# Patient Record
Sex: Female | Born: 1955 | Race: White | Hispanic: No | Marital: Married | Smoking: Former smoker
Health system: Southern US, Community
[De-identification: ages and names within clinical notes are randomized; demographics above are authoritative.]

## PROBLEM LIST (undated history)

## (undated) DIAGNOSIS — H269 Unspecified cataract: Secondary | ICD-10-CM

## (undated) DIAGNOSIS — D352 Benign neoplasm of pituitary gland: Secondary | ICD-10-CM

## (undated) DIAGNOSIS — T4145XA Adverse effect of unspecified anesthetic, initial encounter: Secondary | ICD-10-CM

## (undated) DIAGNOSIS — K219 Gastro-esophageal reflux disease without esophagitis: Secondary | ICD-10-CM

## (undated) DIAGNOSIS — D369 Benign neoplasm, unspecified site: Secondary | ICD-10-CM

## (undated) DIAGNOSIS — I10 Essential (primary) hypertension: Secondary | ICD-10-CM

## (undated) DIAGNOSIS — F419 Anxiety disorder, unspecified: Secondary | ICD-10-CM

## (undated) DIAGNOSIS — IMO0001 Reserved for inherently not codable concepts without codable children: Secondary | ICD-10-CM

## (undated) DIAGNOSIS — D497 Neoplasm of unspecified behavior of endocrine glands and other parts of nervous system: Secondary | ICD-10-CM

## (undated) DIAGNOSIS — K589 Irritable bowel syndrome without diarrhea: Secondary | ICD-10-CM

## (undated) DIAGNOSIS — M1990 Unspecified osteoarthritis, unspecified site: Secondary | ICD-10-CM

## (undated) DIAGNOSIS — K529 Noninfective gastroenteritis and colitis, unspecified: Secondary | ICD-10-CM

## (undated) DIAGNOSIS — M7989 Other specified soft tissue disorders: Secondary | ICD-10-CM

## (undated) DIAGNOSIS — E119 Type 2 diabetes mellitus without complications: Secondary | ICD-10-CM

## (undated) DIAGNOSIS — N200 Calculus of kidney: Secondary | ICD-10-CM

## (undated) DIAGNOSIS — E039 Hypothyroidism, unspecified: Secondary | ICD-10-CM

## (undated) DIAGNOSIS — I499 Cardiac arrhythmia, unspecified: Secondary | ICD-10-CM

## (undated) DIAGNOSIS — E079 Disorder of thyroid, unspecified: Secondary | ICD-10-CM

## (undated) DIAGNOSIS — R011 Cardiac murmur, unspecified: Secondary | ICD-10-CM

## (undated) HISTORY — DX: Calculus of kidney: N20.0

## (undated) HISTORY — PX: WISDOM TOOTH EXTRACTION: SHX21

## (undated) HISTORY — DX: Unspecified cataract: H26.9

## (undated) HISTORY — DX: Irritable bowel syndrome, unspecified: K58.9

## (undated) HISTORY — PX: CHOLECYSTECTOMY: SHX55

## (undated) HISTORY — PX: CERVICAL CERCLAGE: SHX1329

## (undated) HISTORY — DX: Noninfective gastroenteritis and colitis, unspecified: K52.9

## (undated) HISTORY — DX: Benign neoplasm, unspecified site: D36.9

## (undated) HISTORY — PX: TONSILLECTOMY: SUR1361

## (undated) HISTORY — DX: Neoplasm of unspecified behavior of endocrine glands and other parts of nervous system: D49.7

## (undated) HISTORY — DX: Benign neoplasm of pituitary gland: D35.2

---

## 2000-02-09 ENCOUNTER — Encounter: Admission: RE | Admit: 2000-02-09 | Discharge: 2000-02-09 | Payer: Self-pay | Admitting: Obstetrics & Gynecology

## 2000-02-09 ENCOUNTER — Encounter: Payer: Self-pay | Admitting: Obstetrics & Gynecology

## 2000-06-11 DIAGNOSIS — T8859XA Other complications of anesthesia, initial encounter: Secondary | ICD-10-CM

## 2000-06-11 HISTORY — DX: Other complications of anesthesia, initial encounter: T88.59XA

## 2002-01-16 ENCOUNTER — Other Ambulatory Visit: Admission: RE | Admit: 2002-01-16 | Discharge: 2002-01-16 | Payer: Self-pay | Admitting: Obstetrics & Gynecology

## 2002-10-14 ENCOUNTER — Encounter: Payer: Self-pay | Admitting: Internal Medicine

## 2003-02-23 ENCOUNTER — Other Ambulatory Visit: Admission: RE | Admit: 2003-02-23 | Discharge: 2003-02-23 | Payer: Self-pay | Admitting: Obstetrics & Gynecology

## 2003-03-11 ENCOUNTER — Encounter: Admission: RE | Admit: 2003-03-11 | Discharge: 2003-03-11 | Payer: Self-pay | Admitting: Obstetrics & Gynecology

## 2003-03-11 ENCOUNTER — Encounter: Payer: Self-pay | Admitting: Obstetrics & Gynecology

## 2004-04-26 ENCOUNTER — Ambulatory Visit: Payer: Self-pay | Admitting: Internal Medicine

## 2004-09-04 ENCOUNTER — Other Ambulatory Visit: Admission: RE | Admit: 2004-09-04 | Discharge: 2004-09-04 | Payer: Self-pay | Admitting: Obstetrics & Gynecology

## 2004-09-14 ENCOUNTER — Ambulatory Visit: Payer: Self-pay | Admitting: Internal Medicine

## 2004-12-07 ENCOUNTER — Ambulatory Visit: Payer: Self-pay | Admitting: Family Medicine

## 2004-12-13 ENCOUNTER — Ambulatory Visit: Payer: Self-pay | Admitting: Internal Medicine

## 2005-03-22 ENCOUNTER — Ambulatory Visit: Payer: Self-pay | Admitting: Internal Medicine

## 2005-04-06 ENCOUNTER — Ambulatory Visit: Payer: Self-pay | Admitting: Internal Medicine

## 2005-06-05 ENCOUNTER — Ambulatory Visit: Payer: Self-pay | Admitting: Family Medicine

## 2005-06-15 ENCOUNTER — Ambulatory Visit: Payer: Self-pay | Admitting: Internal Medicine

## 2006-01-27 ENCOUNTER — Encounter: Payer: Self-pay | Admitting: Internal Medicine

## 2006-04-16 ENCOUNTER — Ambulatory Visit: Payer: Self-pay | Admitting: Internal Medicine

## 2006-05-16 ENCOUNTER — Ambulatory Visit: Payer: Self-pay | Admitting: Internal Medicine

## 2006-05-17 ENCOUNTER — Ambulatory Visit: Payer: Self-pay | Admitting: Pulmonary Disease

## 2006-06-27 ENCOUNTER — Ambulatory Visit: Payer: Self-pay | Admitting: Pulmonary Disease

## 2006-06-27 ENCOUNTER — Ambulatory Visit (HOSPITAL_BASED_OUTPATIENT_CLINIC_OR_DEPARTMENT_OTHER): Admission: RE | Admit: 2006-06-27 | Discharge: 2006-06-27 | Payer: Self-pay | Admitting: Pulmonary Disease

## 2006-12-18 ENCOUNTER — Ambulatory Visit: Payer: Self-pay | Admitting: Internal Medicine

## 2006-12-18 DIAGNOSIS — S86819A Strain of other muscle(s) and tendon(s) at lower leg level, unspecified leg, initial encounter: Secondary | ICD-10-CM

## 2006-12-18 DIAGNOSIS — S838X9A Sprain of other specified parts of unspecified knee, initial encounter: Secondary | ICD-10-CM | POA: Insufficient documentation

## 2006-12-30 ENCOUNTER — Ambulatory Visit: Payer: Self-pay | Admitting: Internal Medicine

## 2006-12-30 LAB — CONVERTED CEMR LAB: Rapid Strep: NEGATIVE

## 2007-02-27 DIAGNOSIS — D353 Benign neoplasm of craniopharyngeal duct: Secondary | ICD-10-CM

## 2007-02-27 DIAGNOSIS — H409 Unspecified glaucoma: Secondary | ICD-10-CM | POA: Insufficient documentation

## 2007-02-27 DIAGNOSIS — J309 Allergic rhinitis, unspecified: Secondary | ICD-10-CM | POA: Insufficient documentation

## 2007-02-27 DIAGNOSIS — D352 Benign neoplasm of pituitary gland: Secondary | ICD-10-CM

## 2007-03-14 ENCOUNTER — Observation Stay: Payer: Self-pay | Admitting: Internal Medicine

## 2007-03-14 ENCOUNTER — Other Ambulatory Visit: Payer: Self-pay

## 2007-03-15 ENCOUNTER — Encounter: Payer: Self-pay | Admitting: Internal Medicine

## 2007-03-18 ENCOUNTER — Encounter: Payer: Self-pay | Admitting: Internal Medicine

## 2007-03-25 ENCOUNTER — Ambulatory Visit: Payer: Self-pay | Admitting: Internal Medicine

## 2007-03-25 DIAGNOSIS — E785 Hyperlipidemia, unspecified: Secondary | ICD-10-CM | POA: Insufficient documentation

## 2007-03-25 DIAGNOSIS — I10 Essential (primary) hypertension: Secondary | ICD-10-CM | POA: Insufficient documentation

## 2007-03-25 DIAGNOSIS — I872 Venous insufficiency (chronic) (peripheral): Secondary | ICD-10-CM | POA: Insufficient documentation

## 2007-03-25 LAB — CONVERTED CEMR LAB
Albumin: 3.8 g/dL (ref 3.5–5.2)
BUN: 6 mg/dL (ref 6–23)
CO2: 30 meq/L (ref 19–32)
Calcium: 9.6 mg/dL (ref 8.4–10.5)
Chloride: 108 meq/L (ref 96–112)
Creatinine, Ser: 0.6 mg/dL (ref 0.4–1.2)
GFR calc Af Amer: 137 mL/min
GFR calc non Af Amer: 113 mL/min
Glucose, Bld: 112 mg/dL — ABNORMAL HIGH (ref 70–99)
Phosphorus: 3.6 mg/dL (ref 2.3–4.6)
Potassium: 4.8 meq/L (ref 3.5–5.1)
Prolactin: 39.9 ng/mL
Sodium: 144 meq/L (ref 135–145)

## 2007-04-23 ENCOUNTER — Telehealth: Payer: Self-pay | Admitting: Internal Medicine

## 2007-04-24 ENCOUNTER — Telehealth (INDEPENDENT_AMBULATORY_CARE_PROVIDER_SITE_OTHER): Payer: Self-pay | Admitting: *Deleted

## 2007-04-28 ENCOUNTER — Ambulatory Visit: Payer: Self-pay | Admitting: Family Medicine

## 2007-04-28 DIAGNOSIS — J019 Acute sinusitis, unspecified: Secondary | ICD-10-CM

## 2007-07-01 ENCOUNTER — Encounter (INDEPENDENT_AMBULATORY_CARE_PROVIDER_SITE_OTHER): Payer: Self-pay | Admitting: *Deleted

## 2008-02-18 ENCOUNTER — Telehealth: Payer: Self-pay | Admitting: Internal Medicine

## 2008-02-19 ENCOUNTER — Telehealth: Payer: Self-pay | Admitting: Family Medicine

## 2008-06-09 ENCOUNTER — Ambulatory Visit: Payer: Self-pay | Admitting: Internal Medicine

## 2008-06-10 LAB — CONVERTED CEMR LAB
Albumin: 3.7 g/dL (ref 3.5–5.2)
BUN: 8 mg/dL (ref 6–23)
CO2: 30 meq/L (ref 19–32)
Calcium: 9.2 mg/dL (ref 8.4–10.5)
Chloride: 103 meq/L (ref 96–112)
Creatinine, Ser: 0.6 mg/dL (ref 0.4–1.2)
GFR calc Af Amer: 136 mL/min
GFR calc non Af Amer: 112 mL/min
Glucose, Bld: 97 mg/dL (ref 70–99)
Phosphorus: 4.7 mg/dL — ABNORMAL HIGH (ref 2.3–4.6)
Potassium: 4.2 meq/L (ref 3.5–5.1)
Prolactin: 30.6 ng/mL
Sodium: 140 meq/L (ref 135–145)
TSH: 5.29 microintl units/mL (ref 0.35–5.50)

## 2009-03-17 ENCOUNTER — Ambulatory Visit: Payer: Self-pay | Admitting: Internal Medicine

## 2010-08-22 ENCOUNTER — Ambulatory Visit: Payer: Self-pay | Admitting: Internal Medicine

## 2010-10-27 NOTE — Procedures (Signed)
Diamond Sosa, CROKER NO.:  [id number]   MEDICAL RECORD NO.:  [id number]          PATIENT TYPE:  OUT   LOCATION:  SLEEP CENTER                 FACILITY:  [facility]   PHYSICIAN:  Coralyn Helling, MD        DATE OF BIRTH:  [birth year]   DATE OF STUDY:  06/27/2006                            NOCTURNAL POLYSOMNOGRAM   INDICATION FOR STUDY:  This individual has symptoms of sleep disruption  and excessive daytime sleepiness. She is referred to the sleep lab for  evaluation of hypersomnia with obstructive sleep apnea.   EPWORTH SLEEPINESS SCORE:  11.   MEDICATIONS:  None.   SLEEP ARCHITECTURE:  Total recording time was 370 minutes. Total sleep  time was 100 minutes. Sleep efficiency was 27% which is significantly  reduced. Sleep latency was 43.5 minutes. The study was notable for the  reduction in the percentage of slow wave sleep to 4% of the study and of  REM sleep to 0% of the study. The patient slept exclusively in the non  supine position.   RESPIRATORY DATA:  The average respiratory rate was 15. The  apnea/hypopnea index was 0. Occasional snoring was noted by the  technician.   OXYGEN DATA:  The baseline oxygenation was 98%. The oxygen saturation  was 92%. The patient spent the entire test period with an oxygen  saturation between 91 to 100%.   CARDIAC DATA:  Rhythm strip showed normal sinus rhythm.   MOVEMENT-PARASOMNIA:  The periodic limb movement index was 0. The  patient was noted to be quite anxious prior to the study and made a  comment to the technician that it was difficult for her to fall asleep  away from home.   IMPRESSIONS-RECOMMENDATIONS:  This study did not show evidence for sleep  disorder breathing/  However she had a significantly reduced sleep time  and quite fragmented sleep. Initially she did not have supine sleep or  REM sleep. Therefore the severity of any possible sleep disorder  breathing could be significantly  underestimated.  Consideration should be given to having her return to  the sleep lab for a repeat overnight polysomnogram with the possible  addition of a sleep aide to assist with sleep onset and sleep  maintenance.      Coralyn Helling, MD  Diplomat, American Board of Sleep Medicine  Electronically Signed     VS/MEDQ  D:  07/16/2006 16:16:37  T:  07/16/2006 20:40:37  Job:  [id number]

## 2010-10-27 NOTE — Assessment & Plan Note (Signed)
[facility]                             PULMONARY OFFICE NOTE   KAROLYNA, BIANCHINI                       MRN:          [id number]  DATE:05/17/2006                            DOB:          [birth year]    REFERRING PHYSICIAN:  Karie Schwalbe, MD   I met Diamond Sosa today for evaluation of her sleep difficulties.   She says she has noticed this more so over the last two years.  Apparently, she had a family friend who had died recently with similar  symptoms to [name], and this in a sense had been part of the  interest for her getting further evaluation. She will typically go to  bed around 10:00 at night, although she is frequently falling asleep  before this unintentionally. She sleeps through the night for the most  part and wakes up at about 6 in the morning. She says that when she  wakes up she still feels quite tired as well as getting occasional  headaches. Her husband has told her that she snores at night as well as  stops breathing at night. She will tend to breath more through her mouth  at night as well as get a dry mouth at night. She will also occasionally  drool while she is asleep, and she stated that she does not really dream  very much. She will often times end up taking naps on the weekend to  catch up on her sleep, and she will fall asleep while doing things like  watching TV. Her Epworth score today is 17 out of 24. There is no  history of sleep walking or sleep talking. She denies any symptoms of  restless leg syndrome. There is no history of sleep hallucinations,  sleep paralysis or cataplexy. She is not currently using anything to  help herself fall asleep at night or stay awake during the day.   PAST MEDICAL HISTORY:  1. Significant for borderline hypertension.  2. Map-dot-finger dystrophy of the eyes.  3. Chronic headaches.  4. Allergies.  5. A pituitary microadenoma.   PAST SURGICAL HISTORY:  Significant for a  tonsillectomy and  cholecystectomy.   CURRENT MEDICATIONS:  Muro eye ointment and cystine eye drops.   SHE HAS AN ALLERGY TO ERYTHROMYCIN WHICH CAUSES HER GI DISTRESS.   FAMILY HISTORY:  Significant for her mother who had breast cancer, heart  disease and allergies. Her father had allergies, heart disease and  prostate cancer. Her sister who had arthritis and allergies.   SOCIAL HISTORY:  She is married. 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 She has one child. She quit smoking in  2002. She used to smoke 2 packs of cigarettes per day for 23 years.  There is no significant alcohol use.   REVIEW OF SYSTEMS:  She has had approximately 20 pound weight gain over  the last 2 years.   On physical exam, she is 5 feet, 3 inches tall, 249 pounds. Temperature  is 98.2, blood pressure 138/86, heart rate 104, oxygen saturation is 96%  on room air.  HEENT:  Pupils are active. Extraocular muscles are intact. There is no  sinus tenderness. She has a narrow nasal angle. She has a Mallampati IV  airway with an enlarged tongue with scalloped borders of the tongue as  well as an elongated uvula and prominent tonsils. There is no  lymphadenopathy and no thyromegaly.  HEART:  S1 and S2. Tachycardia. Irregular.  CHEST:  Clear to auscultation.  ABDOMEN:  Obese, soft, nontender.  EXTREMITIES:  She has minimal ankle edema.  NEUROLOGICAL:  No focal deficits were appreciated.   IMPRESSION:  Is that of hypersomnia with excessive daytime sleepiness.  The primary concerns that I would have is that she has obstructive sleep  apnea. To further evaluate this, I will arrange for her to undergo a  split-night polysomnogram. In the meantime, I have discussed with her  the importance of diet, exercise and weight reduction as well as the  avoidance of alcohol and sedatives. Driving precautions were discussed  with her as well.   I will follow up with her in approximately three to  four weeks to review  the results of her sleep study.     Coralyn Helling, MD  Electronically Signed    VS/MedQ  DD: 05/17/2006  DT: 05/18/2006  Job #: [id number]   cc:   Karie Schwalbe, MD

## 2011-02-13 ENCOUNTER — Other Ambulatory Visit: Payer: Self-pay | Admitting: Internal Medicine

## 2011-06-27 ENCOUNTER — Other Ambulatory Visit: Payer: Self-pay | Admitting: Internal Medicine

## 2012-02-18 ENCOUNTER — Other Ambulatory Visit: Payer: Self-pay | Admitting: Internal Medicine

## 2012-02-19 NOTE — Telephone Encounter (Signed)
Dr. Darrick Huntsman please advise, patient has not been seen here and she was a previous patient of Dr. Alphonsus Sias hasn't been seen since 2009.

## 2012-02-19 NOTE — Telephone Encounter (Signed)
Patient last seen 2009 by Dr. Alphonsus Sias, is patient seeing Dr.Tullo now? Dr. Darrick Huntsman last filled 06/27/2011, but patient was not seen. Please advise

## 2012-02-20 ENCOUNTER — Other Ambulatory Visit: Payer: Self-pay | Admitting: Internal Medicine

## 2012-02-20 NOTE — Telephone Encounter (Signed)
No refills until she makes an appt

## 2012-02-21 ENCOUNTER — Telehealth: Payer: Self-pay | Admitting: Internal Medicine

## 2012-02-21 MED ORDER — METOPROLOL SUCCINATE ER 25 MG PO TB24: 25.0000 mg | ORAL_TABLET | Freq: Every day | ORAL | Status: DC

## 2012-02-21 NOTE — Telephone Encounter (Signed)
Patient made appt for November, Rx has been refilled and patient notified.

## 2012-02-21 NOTE — Telephone Encounter (Signed)
Please call her today at [phone], she is completely out of her blood pressure meds. I tried to explain to patient since she hasn't been here Dr. Darrick Huntsman wasn't going to keep refilling her medication it had been over a year since she has seen patient. She did take an appointment for November 12.  Please advise her if we aren't going to refill her medication she said she will have to find another provider that will write the prescription for her she has already been 3 days without medication.

## 2012-02-21 NOTE — Telephone Encounter (Signed)
Please work  [phone]  Until 4pm today Please ask reception to page her Pt has new pt appointment 11/12 she was an old [facility]mp patient.  She needs to have her bp meds refilled until 04/22/12 or what would you recommend her to do to get her meds until then Pt is out of meds.   She didn't reliaze that she hadn't see dr Darrick Huntsman in a year Please call pt ASAP

## 2012-02-21 NOTE — Telephone Encounter (Signed)
Patient has made an appt, rx has been sent in.

## 2012-04-22 ENCOUNTER — Ambulatory Visit: Payer: Self-pay | Admitting: Internal Medicine

## 2012-08-20 ENCOUNTER — Ambulatory Visit: Payer: Self-pay | Admitting: Internal Medicine

## 2013-02-24 ENCOUNTER — Other Ambulatory Visit: Payer: Self-pay | Admitting: *Deleted

## 2014-02-01 DIAGNOSIS — E119 Type 2 diabetes mellitus without complications: Secondary | ICD-10-CM | POA: Insufficient documentation

## 2014-11-19 DIAGNOSIS — K589 Irritable bowel syndrome without diarrhea: Secondary | ICD-10-CM | POA: Insufficient documentation

## 2015-01-25 ENCOUNTER — Emergency Department
Admission: EM | Admit: 2015-01-25 | Discharge: 2015-01-25 | Disposition: A | Discharge status: Home / Self Care | Payer: No Typology Code available for payment source | Attending: Emergency Medicine | Admitting: Emergency Medicine

## 2015-01-25 ENCOUNTER — Encounter: Payer: Self-pay | Admitting: Emergency Medicine

## 2015-01-25 DIAGNOSIS — I159 Secondary hypertension, unspecified: Secondary | ICD-10-CM

## 2015-01-25 DIAGNOSIS — Z87891 Personal history of nicotine dependence: Secondary | ICD-10-CM | POA: Insufficient documentation

## 2015-01-25 DIAGNOSIS — N390 Urinary tract infection, site not specified: Secondary | ICD-10-CM | POA: Insufficient documentation

## 2015-01-25 DIAGNOSIS — E119 Type 2 diabetes mellitus without complications: Secondary | ICD-10-CM | POA: Insufficient documentation

## 2015-01-25 DIAGNOSIS — H73893 Other specified disorders of tympanic membrane, bilateral: Secondary | ICD-10-CM | POA: Diagnosis not present

## 2015-01-25 DIAGNOSIS — J019 Acute sinusitis, unspecified: Secondary | ICD-10-CM | POA: Diagnosis not present

## 2015-01-25 DIAGNOSIS — I10 Essential (primary) hypertension: Secondary | ICD-10-CM | POA: Diagnosis present

## 2015-01-25 DIAGNOSIS — Z79899 Other long term (current) drug therapy: Secondary | ICD-10-CM | POA: Diagnosis not present

## 2015-01-25 DIAGNOSIS — R252 Cramp and spasm: Secondary | ICD-10-CM | POA: Insufficient documentation

## 2015-01-25 DIAGNOSIS — Z792 Long term (current) use of antibiotics: Secondary | ICD-10-CM | POA: Insufficient documentation

## 2015-01-25 HISTORY — DX: Type 2 diabetes mellitus without complications: E11.9

## 2015-01-25 HISTORY — DX: Essential (primary) hypertension: I10

## 2015-01-25 LAB — COMPREHENSIVE METABOLIC PANEL
ALK PHOS: 80 U/L (ref 38–126)
ALT: 31 U/L (ref 14–54)
AST: 27 U/L (ref 15–41)
Albumin: 3.9 g/dL (ref 3.5–5.0)
Anion gap: 11 (ref 5–15)
BUN: 17 mg/dL (ref 6–20)
CALCIUM: 9.2 mg/dL (ref 8.9–10.3)
CO2: 26 mmol/L (ref 22–32)
CREATININE: 0.76 mg/dL (ref 0.44–1.00)
Chloride: 104 mmol/L (ref 101–111)
Glucose, Bld: 160 mg/dL — ABNORMAL HIGH (ref 65–99)
Potassium: 3.8 mmol/L (ref 3.5–5.1)
Sodium: 141 mmol/L (ref 135–145)
Total Bilirubin: 0.2 mg/dL — ABNORMAL LOW (ref 0.3–1.2)
Total Protein: 7.4 g/dL (ref 6.5–8.1)

## 2015-01-25 LAB — CBC
HCT: 41.1 % (ref 35.0–47.0)
HEMOGLOBIN: 13.6 g/dL (ref 12.0–16.0)
MCH: 29.3 pg (ref 26.0–34.0)
MCHC: 33.1 g/dL (ref 32.0–36.0)
MCV: 88.7 fL (ref 80.0–100.0)
PLATELETS: 319 10*3/uL (ref 150–440)
RBC: 4.64 MIL/uL (ref 3.80–5.20)
RDW: 13.4 % (ref 11.5–14.5)
WBC: 10.4 10*3/uL (ref 3.6–11.0)

## 2015-01-25 LAB — URINALYSIS COMPLETE WITH MICROSCOPIC (ARMC ONLY)
BILIRUBIN URINE: NEGATIVE
Bacteria, UA: NONE SEEN
GLUCOSE, UA: NEGATIVE mg/dL
Ketones, ur: NEGATIVE mg/dL
Nitrite: NEGATIVE
Protein, ur: NEGATIVE mg/dL
Specific Gravity, Urine: 1.018 (ref 1.005–1.030)
pH: 5 (ref 5.0–8.0)

## 2015-01-25 LAB — TROPONIN I

## 2015-01-25 MED ORDER — ONDANSETRON 4 MG PO TBDP
4.0000 mg | ORAL_TABLET | Freq: Once | ORAL | Status: AC
  Administered 2015-01-25: 4 mg via ORAL
  Filled 2015-01-25: qty 1

## 2015-01-25 MED ORDER — HYDROCOD POLST-CPM POLST ER 10-8 MG/5ML PO SUER: 5.0000 mL | Freq: Two times a day (BID) | ORAL | Status: DC

## 2015-01-25 MED ORDER — ONDANSETRON HCL 4 MG PO TABS: 4.0000 mg | ORAL_TABLET | Freq: Three times a day (TID) | ORAL | Status: DC | PRN

## 2015-01-25 MED ORDER — HYDROCOD POLST-CPM POLST ER 10-8 MG/5ML PO SUER
5.0000 mL | Freq: Once | ORAL | Status: AC
  Administered 2015-01-25: 5 mL via ORAL
  Filled 2015-01-25: qty 5

## 2015-01-25 MED ORDER — LEVOFLOXACIN 750 MG PO TABS
750.0000 mg | ORAL_TABLET | Freq: Once | ORAL | Status: AC
  Administered 2015-01-25: 750 mg via ORAL
  Filled 2015-01-25: qty 1

## 2015-01-25 MED ORDER — LEVOFLOXACIN 750 MG PO TABS: 750.0000 mg | ORAL_TABLET | Freq: Every day | ORAL | Status: DC

## 2015-01-25 NOTE — ED Provider Notes (Signed)
[facility] Emergency Department Provider Note  ____________________________________________  Time seen: Approximately 5:20 AM  I have reviewed the triage vital signs and the nursing notes.   HISTORY  Chief Complaint Hypertension    HPI Diamond Sosa is a 59 y.o. female who presents to the ED from home with a chief complaint of elevated blood pressure, sinus drainage, nausea and cramping to legs. Patient was placed on a Z-Pak 4 days ago by her PCP for sinusitis. She was also started on alprazolam for anxiety at the same time. Took her blood pressure this evening when she felt symptoms of nausea and cramping; blood pressure was elevated at 188/90 (baseline 140s/80s). Does not feel improved on Z-Pak. Denies fever, chills, chest pain, shortness of breath, abdominal pain, vomiting, diarrhea, vision changes, headache, weakness, numbness, tingling.   Past Medical History  Diagnosis Date  . Diabetes mellitus without complication   . Hypertension     Patient Active Problem List   Diagnosis Date Noted  . SINUSITIS- ACUTE-NOS 04/28/2007  . HYPERLIPIDEMIA 03/25/2007  . HYPERTENSION 03/25/2007  . VENOUS INSUFFICIENCY, CHRONIC 03/25/2007  . 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 GLAUCOMA 02/27/2007  . ALLERGIC RHINITIS 02/27/2007  . SPRAIN/STRAIN, KNEE/LEG NEC 12/18/2006    Past Surgical History  Procedure Laterality Date  . Cholecystectomy      Current Outpatient Rx  Name  Route  Sig  Dispense  Refill  . ALPRAZolam (XANAX) 0.25 MG tablet   Oral   Take 0.25 mg by mouth at bedtime as needed for sleep.         [name] azithromycin (ZITHROMAX) 250 MG tablet   Oral   Take 250 mg by mouth daily.         . cholecalciferol (VITAMIN D) 1000 UNITS tablet   Oral   Take 1,000 Units by mouth daily.         [name] loratadine (CLARITIN) 10 MG tablet   Oral   Take 10 mg by mouth daily as needed for allergies.         . metFORMIN (GLUCOPHAGE-XR) 750 MG 24 hr  tablet   Oral   Take 750 mg by mouth 2 (two) times daily.         . metoprolol succinate (TOPROL-XL) 50 MG 24 hr tablet   Oral   Take 50 mg by mouth daily. Take with or immediately following a meal.         . metoprolol succinate (TOPROL-XL) 25 MG 24 hr tablet   Oral   Take 1 tablet (25 mg total) by mouth daily. Patient not taking: Reported on 01/25/2015   30 tablet   6     Allergies Cefuroxime axetil and Erythromycin base  No family history on file.  Social History Social History  Substance Use Topics  . Smoking status: Former Research scientist (life sciences)  . Smokeless tobacco: None  . Alcohol Use: No    Review of Systems Constitutional: No fever/chills Eyes: No visual changes. ENT: Positive for sinus pain. No sore throat. Cardiovascular: Denies chest pain. Respiratory: Denies shortness of breath. Gastrointestinal: No abdominal pain.  Positive for nausea, no vomiting.  No diarrhea.  No constipation. Genitourinary: Negative for dysuria. Musculoskeletal: Positive for legs cramping. Negative for back pain. Skin: Negative for rash. Neurological: Negative for headaches, focal weakness or numbness.  10-point ROS otherwise negative.  ____________________________________________   PHYSICAL EXAM:  VITAL SIGNS: ED Triage Vitals  Enc Vitals Group     BP 01/25/15 0107 156/57 mmHg  Pulse Rate 01/25/15 0107 85     Resp 01/25/15 0107 18     Temp 01/25/15 0107 98.8 F (37.1 C)     Temp Source 01/25/15 0107 Oral     SpO2 01/25/15 0107 95 %     Weight 01/25/15 0107 249 lb (112.946 kg)     Height 01/25/15 0107 5\' 4"  (1.626 m)     Head Cir --      Peak Flow --      Pain Score 01/25/15 0109 7     Pain Loc --      Pain Edu? --      Excl. in [place]? --     Constitutional: Alert and oriented. Well appearing and in no acute distress. Eyes: Conjunctivae are normal. PERRL. EOMI. Head: Atraumatic. Frontal and maxillary sinuses tender to palpation. Bilateral TMs with mild fluid, no  erythema. Nose: No congestion/rhinnorhea. Mouth/Throat: Mucous membranes are moist.  Oropharynx non-erythematous. Postnasal drip noted on exam. Hoarse voice noted. There is no muffled voice. There is no drooling. Neck: No stridor. No carotid bruits. Hematological/Lymphatic/Immunilogical: No cervical lymphadenopathy. Cardiovascular: Normal rate, regular rhythm. Grossly normal heart sounds.  Good peripheral circulation. Respiratory: Normal respiratory effort.  No retractions. Lungs CTAB. Gastrointestinal: Soft and nontender. No distention. No abdominal bruits. No CVA tenderness. Musculoskeletal: No lower extremity tenderness nor edema.  No joint effusions. Neurologic:  Normal speech and language. No gross focal neurologic deficits are appreciated. No gait instability. Skin:  Skin is warm, dry and intact. No rash noted. Psychiatric: Mood and affect are normal. Speech and behavior are normal.  ____________________________________________   LABS (all labs ordered are listed, but only abnormal results are displayed)  Labs Reviewed  COMPREHENSIVE METABOLIC PANEL - Abnormal; Notable for the following:    Glucose, Bld 160 (*)    Total Bilirubin 0.2 (*)    All other components within normal limits  URINALYSIS COMPLETEWITH MICROSCOPIC ([facility] ONLY) - Abnormal; Notable for the following:    Color, Urine YELLOW (*)    APPearance CLEAR (*)    Hgb urine dipstick 1+ (*)    Leukocytes, UA 1+ (*)    Squamous Epithelial / LPF 0-5 (*)    All other components within normal limits  CBC  TROPONIN I   ____________________________________________  EKG  ED ECG REPORT I, Esterlene Atiyeh J, the attending physician, personally viewed and interpreted this ECG.   Date: 01/25/2015  EKG Time: 0113  Rate: 82  Rhythm: normal EKG, normal sinus rhythm  Axis: LAD  Intervals:right bundle branch block  ST&T Change:  Nonspecific  ____________________________________________  RADIOLOGY  None ____________________________________________   PROCEDURES  Procedure(s) performed: None  Critical Care performed: No  ____________________________________________   INITIAL IMPRESSION / ASSESSMENT AND PLAN / ED COURSE  Pertinent labs & imaging results that were available during my care of the patient were reviewed by me and considered in my medical decision making (see chart for details).  59 year old female who presents with elevated blood pressure, likely secondary to sinus infection and UTI. Blood pressure currently 120/70 without intervention. Will change azithromycin to Levaquin. ODT Zofran administered for nausea as well as Tussionex for dry cough. Strict return precautions given. Patient and spouse verbalized understanding and agree with plan of care. ____________________________________________   FINAL CLINICAL IMPRESSION(S) / ED DIAGNOSES  Final diagnoses:  Secondary hypertension, unspecified  UTI (lower urinary tract infection)  Acute sinusitis, recurrence not specified, unspecified location      Paulette Blanch, MD 01/25/15 [phone]

## 2015-01-25 NOTE — ED Notes (Addendum)
Patient ambulatory to triage with steady gait, without difficulty or distress noted; pt reports awakening with nausea and cramping to legs; BP 188/90 at home; st currently taking zpak for sinus infection

## 2015-01-25 NOTE — Discharge Instructions (Signed)
1. Change antibiotics to Levaquin 750 mg daily 6 days. 2. You may take Zofran as needed for nausea. 3. Take cough medicine as needed (Tussionex 5 day supply). 4. Recheck blood pressure with your doctor once you are feeling better. 5. Return to the ER for worsening symptoms, persistent vomiting, fever or other concerns.  Sinusitis Sinusitis is redness, soreness, and inflammation of the paranasal sinuses. Paranasal sinuses are air pockets within the bones of your face (beneath the eyes, the middle of the forehead, or above the eyes). In healthy paranasal sinuses, mucus is able to drain out, and air is able to circulate through them by way of your nose. However, when your paranasal sinuses are inflamed, mucus and air can become trapped. This can allow bacteria and other germs to grow and cause infection. Sinusitis can develop quickly and last only a short time (acute) or continue over a long period (chronic). Sinusitis that lasts for more than 12 weeks is considered chronic.  CAUSES  Causes of sinusitis include:  Allergies.  Structural abnormalities, such as displacement of the cartilage that separates your nostrils (deviated septum), which can decrease the air flow through your nose and sinuses and affect sinus drainage.  Functional abnormalities, such as when the small hairs (cilia) that line your sinuses and help remove mucus do not work properly or are not present. SIGNS AND SYMPTOMS  Symptoms of acute and chronic sinusitis are the same. The primary symptoms are pain and pressure around the affected sinuses. Other symptoms include:  Upper toothache.  Earache.  Headache.  Bad breath.  Decreased sense of smell and taste.  A cough, which worsens when you are lying flat.  Fatigue.  Fever.  Thick drainage from your nose, which often is green and may contain pus (purulent).  Swelling and warmth over the affected sinuses. DIAGNOSIS  Your health care provider will perform a physical  exam. During the exam, your health care provider may:  Look in your nose for signs of abnormal growths in your nostrils (nasal polyps).  Tap over the affected sinus to check for signs of infection.  View the inside of your sinuses (endoscopy) using an imaging device that has a light attached (endoscope). If your health care provider suspects that you have chronic sinusitis, one or more of the following tests may be recommended:  Allergy tests.  Nasal culture. A sample of mucus is taken from your nose, sent to a lab, and screened for bacteria.  Nasal cytology. A sample of mucus is taken from your nose and examined by your health care provider to determine if your sinusitis is related to an allergy. TREATMENT  Most cases of acute sinusitis are related to a viral infection and will resolve on their own within 10 days. Sometimes medicines are prescribed to help relieve symptoms (pain medicine, decongestants, nasal steroid sprays, or saline sprays).  However, for sinusitis related to a bacterial infection, your health care provider will prescribe antibiotic medicines. These are medicines that will help kill the bacteria causing the infection.  Rarely, sinusitis is caused by a fungal infection. In theses cases, your health care provider will prescribe antifungal medicine. For some cases of chronic sinusitis, surgery is needed. Generally, these are cases in which sinusitis recurs more than 3 times per year, despite other treatments. HOME CARE INSTRUCTIONS   Drink plenty of water. Water helps thin the mucus so your sinuses can drain more easily.  Use a humidifier.  Inhale steam 3 to 4 times a day (for  example, sit in the bathroom with the shower running).  Apply a warm, moist washcloth to your face 3 to 4 times a day, or as directed by your health care provider.  Use saline nasal sprays to help moisten and clean your sinuses.  Take medicines only as directed by your health care provider.  If  you were prescribed either an antibiotic or antifungal medicine, finish it all even if you start to feel better. SEEK IMMEDIATE MEDICAL CARE IF:  You have increasing pain or severe headaches.  You have nausea, vomiting, or drowsiness.  You have swelling around your face.  You have vision problems.  You have a stiff neck.  You have difficulty breathing. MAKE SURE YOU:   Understand these instructions.  Will watch your condition.  Will get help right away if you are not doing well or get worse. Document Released: 05/28/2005 Document Revised: 10/12/2013 Document Reviewed: 06/12/2011 [facility] Patient Information 2015 [place], [place]. This information is not intended to replace advice given to you by your health care provider. Make sure you discuss any questions you have with your health care provider.  Urinary Tract Infection Urinary tract infections (UTIs) can develop anywhere along your urinary tract. Your urinary tract is your body's drainage system for removing wastes and extra water. Your urinary tract includes two kidneys, two ureters, a bladder, and a urethra. Your kidneys are a pair of bean-shaped organs. Each kidney is about the size of your fist. They are located below your ribs, one on each side of your spine. CAUSES Infections are caused by microbes, which are microscopic organisms, including fungi, viruses, and bacteria. These organisms are so small that they can only be seen through a microscope. Bacteria are the microbes that most commonly cause UTIs. SYMPTOMS  Symptoms of UTIs may vary by age and gender of the patient and by the location of the infection. Symptoms in young women typically include a frequent and intense urge to urinate and a painful, burning feeling in the bladder or urethra during urination. Older women and men are more likely to be tired, shaky, and weak and have muscle aches and abdominal pain. A fever may mean the infection is in your kidneys. Other  symptoms of a kidney infection include pain in your back or sides below the ribs, nausea, and vomiting. DIAGNOSIS To diagnose a UTI, your caregiver will ask you about your symptoms. Your caregiver also will ask to provide a urine sample. The urine sample will be tested for bacteria and white blood cells. White blood cells are made by your body to help fight infection. TREATMENT  Typically, UTIs can be treated with medication. Because most UTIs are caused by a bacterial infection, they usually can be treated with the use of antibiotics. The choice of antibiotic and length of treatment depend on your symptoms and the type of bacteria causing your infection. HOME CARE INSTRUCTIONS  If you were prescribed antibiotics, take them exactly as your caregiver instructs you. Finish the medication even if you feel better after you have only taken some of the medication.  Drink enough water and fluids to keep your urine clear or pale yellow.  Avoid caffeine, tea, and carbonated beverages. They tend to irritate your bladder.  Empty your bladder often. Avoid holding urine for long periods of time.  Empty your bladder before and after sexual intercourse.  After a bowel movement, women should cleanse from front to back. Use each tissue only once. SEEK MEDICAL CARE IF:   You  have back pain.  You develop a fever.  Your symptoms do not begin to resolve within 3 days. SEEK IMMEDIATE MEDICAL CARE IF:   You have severe back pain or lower abdominal pain.  You develop chills.  You have nausea or vomiting.  You have continued burning or discomfort with urination. MAKE SURE YOU:   Understand these instructions.  Will watch your condition.  Will get help right away if you are not doing well or get worse. Document Released: 03/07/2005 Document Revised: 11/27/2011 Document Reviewed: 07/06/2011 [facility] Patient Information 2015 [place], [place]. This information is not intended to replace advice given to  you by your health care provider. Make sure you discuss any questions you have with your health care provider.  Hypertension Hypertension, commonly called high blood pressure, is when the force of blood pumping through your arteries is too strong. Your arteries are the blood vessels that carry blood from your heart throughout your body. A blood pressure reading consists of a higher number over a lower number, such as 110/72. The higher number (systolic) is the pressure inside your arteries when your heart pumps. The lower number (diastolic) is the pressure inside your arteries when your heart relaxes. Ideally you want your blood pressure below 120/80. Hypertension forces your heart to work harder to pump blood. Your arteries may become narrow or stiff. Having hypertension puts you at risk for heart disease, stroke, and other problems.  RISK FACTORS Some risk factors for high blood pressure are controllable. Others are not.  Risk factors you cannot control include:   Race. You may be at higher risk if you are African American.  Age. Risk increases with age.  Gender. Men are at higher risk than women before age 101 years. After age 30, women are at higher risk than men. Risk factors you can control include:  Not getting enough exercise or physical activity.  Being overweight.  Getting too much fat, sugar, calories, or salt in your diet.  Drinking too much alcohol. SIGNS AND SYMPTOMS Hypertension does not usually cause signs or symptoms. Extremely high blood pressure (hypertensive crisis) may cause headache, anxiety, shortness of breath, and nosebleed. DIAGNOSIS  To check if you have hypertension, your health care provider will measure your blood pressure while you are seated, with your arm held at the level of your heart. It should be measured at least twice using the same arm. Certain conditions can cause a difference in blood pressure between your right and left arms. A blood pressure  reading that is higher than normal on one occasion does not mean that you need treatment. If one blood pressure reading is high, ask your health care provider about having it checked again. TREATMENT  Treating high blood pressure includes making lifestyle changes and possibly taking medicine. Living a healthy lifestyle can help lower high blood pressure. You may need to change some of your habits. Lifestyle changes may include:  Following the DASH diet. This diet is high in fruits, vegetables, and whole grains. It is low in salt, red meat, and added sugars.  Getting at least 2 hours of brisk physical activity every week.  Losing weight if necessary.  Not smoking.  Limiting alcoholic beverages.  Learning ways to reduce stress. If lifestyle changes are not enough to get your blood pressure under control, your health care provider may prescribe medicine. You may need to take more than one. Work closely with your health care provider to understand the risks and benefits. HOME CARE INSTRUCTIONS  Have your blood pressure rechecked as directed by your health care provider.   Take medicines only as directed by your health care provider. Follow the directions carefully. Blood pressure medicines must be taken as prescribed. The medicine does not work as well when you skip doses. Skipping doses also puts you at risk for problems.   Do not smoke.   Monitor your blood pressure at home as directed by your health care provider. SEEK MEDICAL CARE IF:   You think you are having a reaction to medicines taken.  You have recurrent headaches or feel dizzy.  You have swelling in your ankles.  You have trouble with your vision. SEEK IMMEDIATE MEDICAL CARE IF:  You develop a severe headache or confusion.  You have unusual weakness, numbness, or feel faint.  You have severe chest or abdominal pain.  You vomit repeatedly.  You have trouble breathing. MAKE SURE YOU:   Understand these  instructions.  Will watch your condition.  Will get help right away if you are not doing well or get worse. Document Released: 05/28/2005 Document Revised: 10/12/2013 Document Reviewed: 03/20/2013 [facility] Patient Information 2015 [place], [place]. This information is not intended to replace advice given to you by your health care provider. Make sure you discuss any questions you have with your health care provider.

## 2015-01-25 NOTE — ED Notes (Signed)
Patient with no complaints at this time. Respirations even and unlabored. Skin warm/dry. Discharge instructions reviewed with patient at this time. Patient given opportunity to voice concerns/ask questions. Patient discharged at this time and left Emergency Department with steady gait.   

## 2015-06-17 ENCOUNTER — Emergency Department: Payer: BLUE CROSS/BLUE SHIELD

## 2015-06-17 ENCOUNTER — Emergency Department
Admission: EM | Admit: 2015-06-17 | Discharge: 2015-06-17 | Disposition: A | Discharge status: Home / Self Care | Payer: BLUE CROSS/BLUE SHIELD | Attending: Emergency Medicine | Admitting: Emergency Medicine

## 2015-06-17 ENCOUNTER — Encounter: Payer: Self-pay | Admitting: Emergency Medicine

## 2015-06-17 DIAGNOSIS — Z792 Long term (current) use of antibiotics: Secondary | ICD-10-CM | POA: Insufficient documentation

## 2015-06-17 DIAGNOSIS — E119 Type 2 diabetes mellitus without complications: Secondary | ICD-10-CM | POA: Insufficient documentation

## 2015-06-17 DIAGNOSIS — Z87891 Personal history of nicotine dependence: Secondary | ICD-10-CM | POA: Diagnosis not present

## 2015-06-17 DIAGNOSIS — R109 Unspecified abdominal pain: Secondary | ICD-10-CM

## 2015-06-17 DIAGNOSIS — Z7984 Long term (current) use of oral hypoglycemic drugs: Secondary | ICD-10-CM | POA: Insufficient documentation

## 2015-06-17 DIAGNOSIS — I10 Essential (primary) hypertension: Secondary | ICD-10-CM | POA: Insufficient documentation

## 2015-06-17 DIAGNOSIS — N201 Calculus of ureter: Secondary | ICD-10-CM | POA: Insufficient documentation

## 2015-06-17 DIAGNOSIS — Z79899 Other long term (current) drug therapy: Secondary | ICD-10-CM | POA: Diagnosis not present

## 2015-06-17 DIAGNOSIS — R1031 Right lower quadrant pain: Secondary | ICD-10-CM | POA: Diagnosis present

## 2015-06-17 HISTORY — DX: Neoplasm of unspecified behavior of endocrine glands and other parts of nervous system: D49.7

## 2015-06-17 HISTORY — DX: Disorder of thyroid, unspecified: E07.9

## 2015-06-17 LAB — URINALYSIS COMPLETE WITH MICROSCOPIC (ARMC ONLY)
Bilirubin Urine: NEGATIVE
Glucose, UA: >500 mg/dL — AB
Leukocytes, UA: NEGATIVE
Nitrite: NEGATIVE
PROTEIN: NEGATIVE mg/dL
Specific Gravity, Urine: 1.013 (ref 1.005–1.030)
pH: 6 (ref 5.0–8.0)

## 2015-06-17 LAB — CBC
HCT: 41.4 % (ref 35.0–47.0)
HEMOGLOBIN: 13.6 g/dL (ref 12.0–16.0)
MCH: 29.4 pg (ref 26.0–34.0)
MCHC: 32.8 g/dL (ref 32.0–36.0)
MCV: 89.5 fL (ref 80.0–100.0)
Platelets: 335 10*3/uL (ref 150–440)
RBC: 4.62 MIL/uL (ref 3.80–5.20)
RDW: 13.7 % (ref 11.5–14.5)
WBC: 14.7 10*3/uL — ABNORMAL HIGH (ref 3.6–11.0)

## 2015-06-17 LAB — BASIC METABOLIC PANEL
Anion gap: 10 (ref 5–15)
BUN: 20 mg/dL (ref 6–20)
CHLORIDE: 102 mmol/L (ref 101–111)
CO2: 27 mmol/L (ref 22–32)
CREATININE: 1.03 mg/dL — AB (ref 0.44–1.00)
Calcium: 9.5 mg/dL (ref 8.9–10.3)
GFR calc Af Amer: >60 mL/min (ref >60)
GFR calc non Af Amer: 59 mL/min — ABNORMAL LOW (ref >60)
GLUCOSE: 277 mg/dL — AB (ref 65–99)
Potassium: 4.1 mmol/L (ref 3.5–5.1)
SODIUM: 139 mmol/L (ref 135–145)

## 2015-06-17 MED ORDER — ONDANSETRON HCL 4 MG PO TABS: 4.0000 mg | ORAL_TABLET | Freq: Three times a day (TID) | ORAL | Status: DC | PRN

## 2015-06-17 MED ORDER — ONDANSETRON HCL 4 MG/2ML IJ SOLN
INTRAMUSCULAR | Status: AC
  Administered 2015-06-17: 4 mg via INTRAVENOUS
  Filled 2015-06-17: qty 2

## 2015-06-17 MED ORDER — MORPHINE SULFATE (PF) 4 MG/ML IV SOLN
INTRAVENOUS | Status: AC
  Administered 2015-06-17: 4 mg via INTRAVENOUS
  Filled 2015-06-17: qty 1

## 2015-06-17 MED ORDER — ONDANSETRON HCL 4 MG/2ML IJ SOLN
4.0000 mg | Freq: Once | INTRAMUSCULAR | Status: AC
  Administered 2015-06-17: 4 mg via INTRAVENOUS

## 2015-06-17 MED ORDER — TAMSULOSIN HCL 0.4 MG PO CAPS
0.4000 mg | ORAL_CAPSULE | Freq: Every day | ORAL | Status: DC
  Administered 2015-06-17: 0.4 mg via ORAL
  Filled 2015-06-17: qty 1

## 2015-06-17 MED ORDER — MORPHINE SULFATE (PF) 4 MG/ML IV SOLN
4.0000 mg | Freq: Once | INTRAVENOUS | Status: AC
  Administered 2015-06-17: 4 mg via INTRAVENOUS

## 2015-06-17 MED ORDER — HYDROMORPHONE HCL 1 MG/ML IJ SOLN: 1.0000 mg | Freq: Once | INTRAMUSCULAR | Status: DC

## 2015-06-17 MED ORDER — OXYCODONE-ACETAMINOPHEN 5-325 MG PO TABS
2.0000 | ORAL_TABLET | Freq: Once | ORAL | Status: AC
  Administered 2015-06-17: 2 via ORAL

## 2015-06-17 MED ORDER — HYDROMORPHONE HCL 1 MG/ML IJ SOLN
1.0000 mg | Freq: Once | INTRAMUSCULAR | Status: AC
  Administered 2015-06-17: 1 mg via INTRAVENOUS

## 2015-06-17 MED ORDER — HYDROMORPHONE HCL 1 MG/ML IJ SOLN
INTRAMUSCULAR | Status: AC
  Administered 2015-06-17: 1 mg via INTRAVENOUS
  Filled 2015-06-17: qty 1

## 2015-06-17 MED ORDER — KETOROLAC TROMETHAMINE 10 MG PO TABS: 10.0000 mg | ORAL_TABLET | Freq: Three times a day (TID) | ORAL | Status: DC | PRN

## 2015-06-17 MED ORDER — SODIUM CHLORIDE 0.9 % IV BOLUS (SEPSIS)
1000.0000 mL | Freq: Once | INTRAVENOUS | Status: AC
  Administered 2015-06-17: 1000 mL via INTRAVENOUS

## 2015-06-17 MED ORDER — OXYCODONE-ACETAMINOPHEN 5-325 MG PO TABS: 1.0000 | ORAL_TABLET | ORAL | Status: DC | PRN

## 2015-06-17 MED ORDER — HYDROMORPHONE HCL 1 MG/ML IJ SOLN
INTRAMUSCULAR | Status: AC
  Filled 2015-06-17: qty 1

## 2015-06-17 MED ORDER — TAMSULOSIN HCL 0.4 MG PO CAPS: 0.4000 mg | ORAL_CAPSULE | Freq: Every day | ORAL | Status: DC

## 2015-06-17 MED ORDER — KETOROLAC TROMETHAMINE 30 MG/ML IJ SOLN
30.0000 mg | Freq: Once | INTRAMUSCULAR | Status: AC
  Administered 2015-06-17: 30 mg via INTRAVENOUS
  Filled 2015-06-17: qty 1

## 2015-06-17 NOTE — ED Notes (Signed)
Right flank and lower abdominal pain.  Onset of symptoms today at 1500.  Describes a similar experience 2-3 nights ago that stopped.

## 2015-06-17 NOTE — ED Provider Notes (Signed)
[facility] Emergency Department Provider Note   ____________________________________________  Time seen: Approximately 6 PM I have reviewed the triage vital signs and the triage nursing note.  HISTORY  Chief Complaint Flank Pain   Historian Patient  HPI Diamond Sosa is a 60 y.o. female who's had right flank pain extending into the right lower abdomen since this afternoon. Constant but waxing and waning and severe. She had some symptoms a few nights ago, but that went away. She's had a lot of urinary tract infections over the past year, but this is for an worse. No fever. No diarrhea. Positive for nausea and vomiting, which she thinks is probably due to severe pain.  No exacerbating only being factors. Positive for dysuria. No hematuria.  History of cholecystectomy, and typically has 5-6 stools per day which are loose, she has not had vomiting today.   Past Medical History  Diagnosis Date  . Diabetes mellitus without complication ([place])   . Hypertension   . Thyroid disease     hypothyroid  . Pituitary tumor [facility])     Patient Active Problem List   Diagnosis Date Noted  . SINUSITIS- ACUTE-NOS 04/28/2007  . HYPERLIPIDEMIA 03/25/2007  . HYPERTENSION 03/25/2007  . VENOUS INSUFFICIENCY, CHRONIC 03/25/2007  . 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 GLAUCOMA 02/27/2007  . ALLERGIC RHINITIS 02/27/2007  . SPRAIN/STRAIN, KNEE/LEG NEC 12/18/2006    Past Surgical History  Procedure Laterality Date  . Cholecystectomy      Current Outpatient Rx  Name  Route  Sig  Dispense  Refill  . ALPRAZolam (XANAX) 0.25 MG tablet   Oral   Take 0.25 mg by mouth at bedtime as needed for sleep.         . chlorpheniramine-HYDROcodone (TUSSIONEX PENNKINETIC ER) 10-8 MG/5ML SUER   Oral   Take 5 mLs by mouth 2 (two) times daily.   70 mL   0   . cholecalciferol (VITAMIN D) 1000 UNITS tablet   Oral   Take 1,000 Units by mouth daily.         [name] ketorolac (TORADOL)  10 MG tablet   Oral   Take 1 tablet (10 mg total) by mouth every 8 (eight) hours as needed for moderate pain.   15 tablet   0   . levofloxacin (LEVAQUIN) 750 MG tablet   Oral   Take 1 tablet (750 mg total) by mouth daily.   6 tablet   0   . loratadine (CLARITIN) 10 MG tablet   Oral   Take 10 mg by mouth daily as needed for allergies.         . metFORMIN (GLUCOPHAGE-XR) 750 MG 24 hr tablet   Oral   Take 750 mg by mouth 2 (two) times daily.         . metoprolol succinate (TOPROL-XL) 25 MG 24 hr tablet   Oral   Take 1 tablet (25 mg total) by mouth daily. Patient not taking: Reported on 01/25/2015   30 tablet   6   . metoprolol succinate (TOPROL-XL) 50 MG 24 hr tablet   Oral   Take 50 mg by mouth daily. Take with or immediately following a meal.         . ondansetron (ZOFRAN) 4 MG tablet   Oral   Take 1 tablet (4 mg total) by mouth every 8 (eight) hours as needed for nausea or vomiting.   15 tablet   0   . ondansetron (ZOFRAN) 4 MG tablet  Oral   Take 1 tablet (4 mg total) by mouth every 8 (eight) hours as needed for nausea or vomiting.   10 tablet   0   . oxyCODONE-acetaminophen (ROXICET) 5-325 MG tablet   Oral   Take 1-2 tablets by mouth every 4 (four) hours as needed for severe pain.   20 tablet   0   . tamsulosin (FLOMAX) 0.4 MG CAPS capsule   Oral   Take 1 capsule (0.4 mg total) by mouth daily.   7 capsule   0     Allergies Cefuroxime axetil and Erythromycin base  No family history on file.  Social History Social History  Substance Use Topics  . Smoking status: Former Research scientist (life sciences)  . Smokeless tobacco: None  . Alcohol Use: No    Review of Systems  Constitutional: Negative for fever. Eyes: Negative for visual changes. ENT: Negative for sore throat. Cardiovascular: Negative for chest pain. Respiratory: Negative for shortness of breath. Gastrointestinal: Per history of present illness. Genitourinary: Negative for dysuria. Musculoskeletal:  Negative for back pain. Skin: Negative for rash. Neurological: Negative for headache. 10 point Review of Systems otherwise negative ____________________________________________   PHYSICAL EXAM:  VITAL SIGNS: ED Triage Vitals  Enc Vitals Group     BP --      Pulse Rate 06/17/15 1634 70     Resp 06/17/15 1634 20     Temp 06/17/15 1634 98.1 F (36.7 C)     Temp src --      SpO2 06/17/15 1634 96 %     Weight 06/17/15 1634 250 lb (113.399 kg)     Height 06/17/15 1634 5' 3.5" (1.613 m)     Head Cir --      Peak Flow --      Pain Score 06/17/15 1652 10     Pain Loc --      Pain Edu? --      Excl. in [place]? --      Constitutional: Alert and oriented. Moderate distress due to pain and holding her right side. Eyes: Conjunctivae are normal. PERRL. Normal extraocular movements. ENT   Head: Normocephalic and atraumatic.   Nose: No congestion/rhinnorhea.   Mouth/Throat: Mucous membranes are moist.   Neck: No stridor. Cardiovascular/Chest: Normal rate, regular rhythm.  No murmurs, rubs, or gallops. Respiratory: Normal respiratory effort without tachypnea nor retractions. Breath sounds are clear and equal bilaterally. No wheezes/rales/rhonchi. Gastrointestinal: Soft. No distention, no guarding, no rebound. Obese. Right-sided abdominal tenderness to palpation  Genitourinary/rectal:Deferred Musculoskeletal: Nontender with normal range of motion in all extremities. No joint effusions.  No lower extremity tenderness.  No edema. Neurologic:  Normal speech and language. No gross or focal neurologic deficits are appreciated. Skin:  Skin is warm, dry and intact. No rash noted. Psychiatric: Mood and affect are normal. Speech and behavior are normal. Patient exhibits appropriate insight and judgment.  ____________________________________________   EKG I, Lisa Roca, MD, the attending physician have personally viewed and interpreted all  ECGs.  None ____________________________________________  LABS (pertinent positives/negatives)  Basic metabolic panel significant for glucose 277, creatinine 1.03 White blood cell count 14.7, hemoglobin 13.6 and platelet count 335 Urinalysis:   Too numerous to count red blood cells, rare bacteria, negative for leukocytes, negative for nitrites and 0-5 white blood cells  ____________________________________________  RADIOLOGY All Xrays were viewed by me. Imaging interpreted by Radiologist.  CT abdomen and pelvis:  IMPRESSION: There is an obstructing 3 mm stone within the distal right ureter at the UVJ resulting  in moderate right hydroureteronephrosis.  Additional nonobstructing stones within the left kidney as described above.  Fat stranding of the upper abdominal mesentery which is nonspecific and may be seen with mesenteric panniculitis. While not favored, lymphoma is an additional consideration. Consider clinical, laboratory and follow-up imaging evaluation.  Hepatic steatosis. __________________________________________  PROCEDURES  Procedure(s) performed: None  Critical Care performed: None  ____________________________________________   ED COURSE / ASSESSMENT AND PLAN  CONSULTATIONS: None  Pertinent labs & imaging results that were available during my care of the patient were reviewed by me and considered in my medical decision making (see chart for details).  Normal saline bolus, morphine and Zofran were provided for symptomatic relief of right flank pain, clinically felt to likely be kidney stone.  CT scan confirms right UVJ 3 mm kidney stone with mild to moderate Hydro. I discussed this case with the on-call urologist given the increase in creatinine from 0.06 at baseline to 1.03 today, in addition rare bacteria although 0-5 white blood cells and negative for nitrates, and elevated white blood cell count 14.7. Dr. Alyson Ingles recommended no empiric tx for uti,  tx symptomatically for kidney stone and would be ok for outpatient treatment if pain controlled.  ----------------------------------------- 10:05 PM on 06/17/2015 -----------------------------------------  Patient did receive adequate pain control here in the emergency department and is prepared for outpatient management. We did send her home with a urine strainer.  I discussed with her the nonspecific finding of mesenteric inflammation, likely panniculitis, and that other symptoms did not seem consistent with low on the differential lymphoma, but she should follow up next week with primary care physician.  Patient / Family / Caregiver informed of clinical course, medical decision-making process, and agree with plan.   I discussed return precautions, follow-up instructions, and discharged instructions with patient and/or family.  ___________________________________________   FINAL CLINICAL IMPRESSION(S) / ED DIAGNOSES   Final diagnoses:  Right flank pain  Ureterolithiasis              Note: This dictation was prepared with Dragon dictation. Any transcriptional errors that result from this process are unintentional   Lisa Roca, MD 06/17/15 2237

## 2015-06-17 NOTE — Discharge Instructions (Signed)
You were evaluated for right flank pain and found have a kidney stone. Return to the periphery worsening condition including unable to peak, fever, worsening pain.  If you are unable to pass a kidney stone within 1 week, you will need to see a urologist, and may be referred by her primary care physician. If you collector kidney stone while you pass it, you may try and into the lateral your primary care physician's office for analysis.  Your CT scan showed inflammation of the fatty tissue of the abdomen which is likely an inflammatory condition called panniculitis.  You have an incidental finding of a growth on the adrenal gland that appears benign.   Kidney Stones Kidney stones (urolithiasis) are solid masses that form inside your kidneys. The intense pain is caused by the stone moving through the kidney, ureter, bladder, and urethra (urinary tract). When the stone moves, the ureter starts to spasm around the stone. The stone is usually passed in your pee (urine).  HOME CARE  Drink enough fluids to keep your pee clear or pale yellow. This helps to get the stone out.  Take a 24-hour pee (urine) sample as told by your doctor. You may need to take another sample every 6-12 months.  Strain all pee through the provided strainer. Do not pee without peeing through the strainer, not even once. If you pee the stone out, catch it in the strainer. The stone may be as small as a grain of salt. Take this to your doctor. This will help your doctor figure out what you can do to try to prevent more kidney stones.  Only take medicine as told by your doctor.  Make changes to your daily diet as told by your doctor. You may be told to:  Limit how much salt you eat.  Eat 5 or more servings of fruits and vegetables each day.  Limit how much meat, poultry, fish, and eggs you eat.  Keep all follow-up visits as told by your doctor. This is important.  Get follow-up X-rays as told by your doctor. GET HELP  IF: You have pain that gets worse even if you have been taking pain medicine. GET HELP RIGHT AWAY IF:   Your pain does not get better with medicine.  You have a fever or shaking chills.  Your pain increases and gets worse over 18 hours.  You have new belly (abdominal) pain.  You feel faint or pass out.  You are unable to pee.   This information is not intended to replace advice given to you by your health care provider. Make sure you discuss any questions you have with your health care provider.   Document Released: 11/14/2007 Document Revised: 02/16/2015 Document Reviewed: 10/29/2012 Elsevier Interactive Patient Education [employer].

## 2015-06-20 LAB — URINE CULTURE

## 2015-06-24 ENCOUNTER — Encounter: Payer: Self-pay | Admitting: Adult Health

## 2015-06-27 ENCOUNTER — Encounter: Payer: Self-pay | Admitting: *Deleted

## 2015-06-29 ENCOUNTER — Encounter: Payer: Self-pay | Admitting: Obstetrics and Gynecology

## 2015-06-29 ENCOUNTER — Ambulatory Visit: Payer: Self-pay | Admitting: Obstetrics and Gynecology

## 2015-06-29 ENCOUNTER — Telehealth: Payer: Self-pay | Admitting: Urology

## 2015-06-29 NOTE — Telephone Encounter (Signed)
Pt would like for you to give him a call and let him know if the way his urine looks is normal.  Please call him at [phone]

## 2015-07-01 NOTE — Telephone Encounter (Signed)
LMOM

## 2015-07-04 ENCOUNTER — Ambulatory Visit (INDEPENDENT_AMBULATORY_CARE_PROVIDER_SITE_OTHER): Payer: BLUE CROSS/BLUE SHIELD | Admitting: Obstetrics and Gynecology

## 2015-07-04 ENCOUNTER — Encounter: Payer: Self-pay | Admitting: Obstetrics and Gynecology

## 2015-07-04 VITALS — BP 149/70 | HR 80 | Ht 64.0 in | Wt 246.9 lb

## 2015-07-04 DIAGNOSIS — N200 Calculus of kidney: Secondary | ICD-10-CM

## 2015-07-04 LAB — URINALYSIS, COMPLETE
Bilirubin, UA: NEGATIVE
Glucose, UA: NEGATIVE
Ketones, UA: NEGATIVE
Nitrite, UA: NEGATIVE
PH UA: 5 (ref 5.0–7.5)
Specific Gravity, UA: 1.02 (ref 1.005–1.030)
Urobilinogen, Ur: 0.2 mg/dL (ref 0.2–1.0)

## 2015-07-04 LAB — MICROSCOPIC EXAMINATION

## 2015-07-04 NOTE — Progress Notes (Signed)
07/04/2015 4:35 PM   Marchele Armond Hang [birth year] [id number]  Referring provider: Juluis Pitch, MD [address] [place], [place] [address]  Chief Complaint  Patient presents with  . Nephrolithiasis    new pt    HPI:  Patient is a 59 year old female presenting today for follow-up after being seen in the emergency room on 06/17/15 for acute onset of right flank pain. TNTC RBCs on UA.  CT renal stone study demonstrated an obstructing 3 mm stone at the right UVJ with resulting in moderate right hydroureteronephrosis. 2 large left renal stones were also measuring 1.9 cm and 1.2 cm without evidence of obstruction.  No recent gross hematuria but states that she did experience an episode of gross hematuria one year ago. She was told at that time that she had a UTI reports that she was not experiencing any irritating voiding symptoms at that time. Her pain is now completely resolved. She reports no urinary symptoms. She is not aware of stone passage but she reports that she has not been consistent with straining her urine.  No nausea, vomiting or fevers.   No previous history of renal stones.  Reports that she experienced difficulty waking up from general anesthesia in 2002 and is very anxious about being place under again.  Former smoker 2ppd x 18 years.  PMH: Past Medical History  Diagnosis Date  . Diabetes mellitus without complication ([place])   . Hypertension   . Thyroid disease     hypothyroid  . 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 IBS (irritable bowel syndrome)   . Microadenoma ([place])   . Cataracts, bilateral   . Adrenal tumor   . Kidney stone   . Inflammatory bowel disease     Surgical History: Past Surgical History  Procedure Laterality Date  . Cholecystectomy    . Tonsillectomy    . Cervical cerclage      Home Medications:    Medication List       This list is accurate as of: 07/04/15  4:35 PM.  Always use your most recent med  list.               ALPRAZolam 0.25 MG tablet  Commonly known as:  XANAX  Take 0.25 mg by mouth at bedtime as needed for sleep. Reported on 07/04/2015     cholecalciferol 1000 units tablet  Commonly known as:  VITAMIN D  Take 1,000 Units by mouth daily.     loratadine 10 MG tablet  Commonly known as:  CLARITIN  Take 10 mg by mouth daily as needed for allergies.     metFORMIN 750 MG 24 hr tablet  Commonly known as:  GLUCOPHAGE-XR  Take 750 mg by mouth 2 (two) times daily.     metoprolol succinate 50 MG 24 hr tablet  Commonly known as:  TOPROL-XL  Take 50 mg by mouth daily. Take with or immediately following a meal.     ondansetron 4 MG tablet  Commonly known as:  ZOFRAN  Take 1 tablet (4 mg total) by mouth every 8 (eight) hours as needed for nausea or vomiting.     oxyCODONE-acetaminophen 5-325 MG tablet  Commonly known as:  ROXICET  Take 1-2 tablets by mouth every 4 (four) hours as needed for severe pain.     tamsulosin 0.4 MG Caps capsule  Commonly known as:  FLOMAX  Take 1 capsule (0.4 mg total) by mouth daily.  Allergies:  Allergies  Allergen Reactions  . Cefuroxime Axetil     REACTION: GI distress and tongue breaks out  . Erythromycin Base     Family History: Family History  Problem Relation Age of Onset  . Prostate cancer Father   . Urolithiasis Mother   . Kidney disease Neg Hx   . Kidney cancer Neg Hx     Social History:  reports that she has quit smoking. She does not have any smokeless tobacco history on file. She reports that she does not drink alcohol. Her drug history is not on file.  ROS: UROLOGY Frequent Urination?: Yes Hard to postpone urination?: Yes Burning/pain with urination?: No Get up at night to urinate?: Yes Leakage of urine?: Yes Urine stream starts and stops?: No Trouble starting stream?: No Do you have to strain to urinate?: No Blood in urine?: No Urinary tract infection?: No Sexually transmitted disease?:  No Injury to kidneys or bladder?: No Painful intercourse?: No Weak stream?: No Currently pregnant?: No Vaginal bleeding?: No Last menstrual period?: 2003  Gastrointestinal Nausea?: Yes Vomiting?: Yes Indigestion/heartburn?: Yes Diarrhea?: Yes Constipation?: No  Constitutional Fever: No Night sweats?: No Weight loss?: No Fatigue?: Yes  Skin Skin rash/lesions?: No Itching?: No  Eyes Blurred vision?: Yes Double vision?: Yes  Ears/Nose/Throat Sore throat?: Yes Sinus problems?: Yes  Hematologic/Lymphatic Swollen glands?: Yes Easy bruising?: Yes  Cardiovascular Leg swelling?: Yes Chest pain?: No  Respiratory Cough?: Yes Shortness of breath?: Yes  Endocrine Excessive thirst?: No  Musculoskeletal Back pain?: No Joint pain?: Yes  Neurological Headaches?: No Dizziness?: No  Psychologic Depression?: No Anxiety?: Yes  Physical Exam: BP 149/70 mmHg  Pulse 80  Ht 5\' 4"  (1.626 m)  Wt 246 lb 14.4 oz (111.993 kg)  BMI 42.36 kg/m2  Constitutional:  Alert and oriented, No acute distress. HEENT: [place] AT, moist mucus membranes.  Trachea midline, no masses. Cardiovascular: No clubbing, cyanosis, or edema. Respiratory: Normal respiratory effort, no increased work of breathing. GI: Abdomen is soft, nontender, nondistended, no abdominal masses GU: No CVA tenderness.  Skin: No rashes, bruises or suspicious lesions. Lymph: No cervical or inguinal adenopathy. Neurologic: Grossly intact, no focal deficits, moving all 4 extremities. Psychiatric: Normal mood and affect.  Laboratory Data:   Urinalysis Results for orders placed or performed in visit on 07/04/15  Microscopic Examination  Result Value Ref Range   WBC, UA 6-10 (A) 0 -  5 /hpf   RBC, UA 11-30 (A) 0 -  2 /hpf   Epithelial Cells (non renal) 0-10 0 - 10 /hpf   Mucus, UA Present (A) Not Estab.   Bacteria, UA Many (A) None seen/Few  Urinalysis, Complete  Result Value Ref Range   Specific Gravity, UA  1.020 1.005 - 1.030   pH, UA 5.0 5.0 - 7.5   Color, UA Yellow Yellow   Appearance Ur Clear Clear   Leukocytes, UA 1+ (A) Negative   Protein, UA 2+ (A) Negative/Trace   Glucose, UA Negative Negative   Ketones, UA Negative Negative   RBC, UA 2+ (A) Negative   Bilirubin, UA Negative Negative   Urobilinogen, Ur 0.2 0.2 - 1.0 mg/dL   Nitrite, UA Negative Negative   Microscopic Examination See below:      Pertinent Imaging: CLINICAL DATA: Patient with right lower abdominal pain. Acute onset of symptoms.  EXAM: CT ABDOMEN AND PELVIS WITHOUT CONTRAST  TECHNIQUE: Multidetector CT imaging of the abdomen and pelvis was performed following the standard protocol without IV contrast.  COMPARISON: None.  FINDINGS: Lower chest: Normal heart size. Dependent atelectasis within the left lung base. No pleural effusion.  Hepatobiliary: Liver is diffusely low in attenuation compatible with hepatic steatosis. Patient status post cholecystectomy.  Pancreas: Unremarkable  Spleen: Unremarkable  Adrenals/Urinary Tract: There is a 3.6 cm low-attenuation mass within the left adrenal gland most compatible with benign adenoma. Right adrenal gland is unremarkable. There is a 1.9 cm nonobstructing stone within the inferior pole of the left kidney. There is an adjacent 1.2 cm nonobstructing stone within the inferior pole of the left kidney. There is moderate right hydroureteronephrosis to the level of the distal right ureter were there is an obstructing 3 mm stone at the right UVJ (image 111; series 5). There is an additional punctate 1- 2 mm stone within the inferior pole of the right kidney.  Stomach/Bowel: Sigmoid colonic diverticulosis. No CT evidence for acute diverticulitis. Marked stranding involving the upper abdominalu mesentery. Stomach is normal morphology. No free fluid or free intraperitoneal air. The appendix is normal.  Vascular/Lymphatic: Normal caliber abdominal  aorta. Peripheral calcified atherosclerotic plaque. No retroperitoneal lymphadenopathy.  Other: The uterus and adnexal structures are unremarkable.  Musculoskeletal: No aggressive or acute appearing osseous lesions.  IMPRESSION: There is an obstructing 3 mm stone within the distal right ureter at the UVJ resulting in moderate right hydroureteronephrosis.  Additional nonobstructing stones within the left kidney as described above.  Fat stranding of the upper abdominal mesentery which is nonspecific and may be seen with mesenteric panniculitis. While not favored, lymphoma is an additional consideration. Consider clinical, laboratory and follow-up imaging evaluation.  Hepatic steatosis.  Electronically Signed  By: Lovey Newcomer M.D.  On: 06/17/2015 19:31  Assessment & Plan:    1. Nephrolithiasis- Patient has most likely passed her distal right ureteral stone. We will obtain a renal ultrasound and KUB to confirm resolution of hydronephrosis.  We discussed various treatment options including ESWL vs. ureteroscopy, laser lithotripsy, and stent. We discussed the risks and benefits of both including bleeding, infection, damage to surrounding structures, efficacy with need for possible further intervention, and need for temporary ureteral stent. Patient is very anxious about undergoing general anesthesia. I did discuss with her that given her large stone burden that she is not a good candidate for ESWL. I briefly reviewed the potential complications of performing ESWL on stones of her size. I provided her information to review prior to her follow-up appointment to review renal ultrasound results and discuss surgical planning. I will review his CT images with Dr. Erlene Quan. Patient most likely will benefit from a PCNL.  2. Gross hematuria- We discussed the differential diagnosis for hematuria including nephrolithiasis, renal or upper tract tumors, bladder stones, UTIs, or bladder tumors as  well as undetermined etiologies. Per AUA guidelines, I did recommend complete hematuria evaluation including CTU, possible urine cytology, and office cystoscopy.  We will discuss potential workup further at her follow-up appointment. If patient chooses to undergo URS or PCNL a cystoscopy with bilateral wedge retrograde pyelograms can also be performed at that time.  There are no diagnoses linked to this encounter.  Return for RUS/KUB results.  These notes generated with voice recognition software. I apologize for typographical errors.  Herbert Moors, [facility] [address] [place] [employer], [place] [address] [phone]

## 2015-07-04 NOTE — Patient Instructions (Addendum)
Ureteroscopy  A Ureteroscopy is an examination of the upper urinary tract, performed with a ureteroscope that is passed through the urethra, the bladder, and then directly into the ureter. It is performed to find the cause of urine blockage in a ureter, perform a biopsy or identify and evaluate other abnormalities inside the ureters or kidneys. It is useful in the diagnosis and treatment of disorders such as kidney stones, ureteral strictures or other abnormalities of the ureter. Smaller stones in the bladder or lower ureter can be removed in one piece, while bigger ones are usually broken before removal during ureteroscopy.  *After your ureteroscopy, your doctor may need to place a stent in a ureter to drain urine from the kidney to the bladder while swelling in the ureter goes away. The stent may cause some discomfort to your kidney and ureter. The discomfort is generally mild. The stent may be left in for a week or more. You will be instructed to either remove the stent yourself at home by pulling a string protruding from your urethra or to return to our office for removal by your doctor.  After a ureteroscopy you may  have a mild burning feeling when urinating see small amounts of blood in the urine have mild discomfort in the bladder area or kidney area when urinating need to urinate more frequently or urgently *These problems should not last more than 24 hours unless a ureteral stent was placed.  If a stent was placed symptoms symptoms may continue until it is removed. You should notify your doctor right away if bleeding or pain is severe.  To prevent or relieve discomfort it can be helpful to drink 16 ounces of water each hour for 2 hours after the procedure take a warm bath to relieve the burning feeling hold a warm, damp washcloth over the urethral opening to relieve discomfort take an over-the-counter pain reliever  Please notify  our office if you experience any of the following symptoms burning with urination lasting more than 24hrs Fever greater than 100F or present directly to emergency department abdominal or flank pain very bloody, cloudy or foul smelling urine  *If you have any additional questions or concerns please call our office at [phone]  [facility] [address], [place], [place] [address] [phone]    Ureteral Stent  Patient Education A stent is a hollow tube that maintains patency until healing can take place or an obstruction is relieved. It allows urine to flow from the kidney to the bladder.  Indications for stenting include: Relief of ureteral obstruction (stones, cancer, stricture) and provide drainage; Promote healing of the ureter by providing internal support after a ureteral procedure  Prevent potential complications by helping place a guidewire into the ureter; Assist in dilating the ureter before the next ureteroscopy; Bypass obstructions, either from internal or external causes;  Procedure: Under general anesthesia in a cystoscopy suite, the ureteroscope is introduced into the bladder through the urethra and then up into the desired ureter.   Stent Removal: Remove in 2-7 days after ureteroscopy in uncomplicated cases; Remove in 1-2 weeks in cases of ureteral perforation or persisting concern of obstruction; A string  may be left on your stent and you will be instructed by your provider when to gently pull the string to remove your stent at home.  Can be removed in the office with topical anesthesia with a flexible ureteroscope and grasper; Can be removed in the cysto suite under anesthesia for patients unable to tolerate topical anesthesia; If required for long term use (extrinsic compression by tumor, stricture), stents should be changed every 6 months  What to expect while stent is in place Blood in the urine intermittently while  stent is in place. Usually it is most severe the first few days after surgery, but it can persist the entire time that the stent is in place. Symptoms of urinary frequency and urgency that is caused by the lower end of the stent coiling into and irritating the bladder.  Precautions: Do not have sexual intercourse while stent is in place or participate in other strenuous activity.  Please notify your physician's office as soon as possible if: your stent falls out. It is very rare but if the stent comes out, please rinse it with water, place it in a ziplock bag and bring it with you to your appointment. you are experiencing extreme pain, prolonged nausea/vomiting or fever >100.5 [facility] [address] [place], [place] [address] [phone]  Kidney Stones Kidney stones (urolithiasis) are deposits that form inside your kidneys. The intense pain is caused by the stone moving through the urinary tract. When the stone moves, the ureter goes into spasm around the stone. The stone is usually passed in the urine.  CAUSES   A disorder that makes certain neck glands produce too much parathyroid hormone (primary hyperparathyroidism).  A buildup of uric acid crystals, similar to gout in your joints.  Narrowing (stricture) of the ureter.  A kidney obstruction present at birth (congenital obstruction).  Previous surgery on the kidney or ureters.  Numerous kidney infections. SYMPTOMS   Feeling sick to your stomach (nauseous).  Throwing up (vomiting).  Blood in the urine (hematuria).  Pain that usually spreads (radiates) to the groin.  Frequency or urgency of urination. DIAGNOSIS   Taking a history and physical exam.  Blood or urine tests.  CT scan.  Occasionally, an examination of the inside of the urinary bladder (cystoscopy) is performed. TREATMENT   Observation.  Increasing your fluid intake.  Extracorporeal shock wave  lithotripsy--This is a noninvasive procedure that uses shock waves to break up kidney stones.  Surgery may be needed if you have severe pain or persistent obstruction. There are various surgical procedures. Most of the procedures are performed with the use of small instruments. Only small incisions are needed to accommodate these instruments, so recovery time is minimized. The size, location, and chemical composition are all important variables that will determine the proper choice of action for you. Talk to your health care provider to better understand your situation so that you will minimize the risk of injury to yourself and your kidney.  HOME CARE INSTRUCTIONS   Drink enough water and fluids to keep your urine clear or pale yellow. This will help you to pass the stone or stone fragments.  Strain all urine through the provided strainer. Keep all particulate matter and stones for your health care provider to see. The stone causing the pain may be as small as a grain of salt. It is very important to use the strainer each and every time you pass your urine. The collection of  your stone will allow your health care provider to analyze it and verify that a stone has actually passed. The stone analysis will often identify what you can do to reduce the incidence of recurrences.  Only take over-the-counter or prescription medicines for pain, discomfort, or fever as directed by your health care provider.  Keep all follow-up visits as told by your health care provider. This is important.  Get follow-up X-rays if required. The absence of pain does not always mean that the stone has passed. It may have only stopped moving. If the urine remains completely obstructed, it can cause loss of kidney function or even complete destruction of the kidney. It is your responsibility to make sure X-rays and follow-ups are completed. Ultrasounds of the kidney can show blockages and the status of the kidney. Ultrasounds are  not associated with any radiation and can be performed easily in a matter of minutes.  Make changes to your daily diet as told by your health care provider. You may be told to:  Limit the amount of salt that you eat.  Eat 5 or more servings of fruits and vegetables each day.  Limit the amount of meat, poultry, fish, and eggs that you eat.  Collect a 24-hour urine sample as told by your health care provider.You may need to collect another urine sample every 6-12 months. SEEK MEDICAL CARE IF:  You experience pain that is progressive and unresponsive to any pain medicine you have been prescribed. SEEK IMMEDIATE MEDICAL CARE IF:   Pain cannot be controlled with the prescribed medicine.  You have a fever or shaking chills.  The severity or intensity of pain increases over 18 hours and is not relieved by pain medicine.  You develop a new onset of abdominal pain.  You feel faint or pass out.  You are unable to urinate.   This information is not intended to replace advice given to you by your health care provider. Make sure you discuss any questions you have with your health care provider.   Document Released: 05/28/2005 Document Revised: 02/16/2015 Document Reviewed: 10/29/2012 Elsevier Interactive Patient Education 2016 [place] Guidelines to Help Prevent Kidney Stones Your risk of kidney stones can be decreased by adjusting the foods you eat. The most important thing you can do is drink enough fluid. You should drink enough fluid to keep your urine clear or pale yellow. The following guidelines provide specific information for the type of kidney stone you have had. GUIDELINES ACCORDING TO TYPE OF KIDNEY STONE Calcium Oxalate Kidney Stones  Reduce the amount of salt you eat. Foods that have a lot of salt cause your body to release excess calcium into your urine. The excess calcium can combine with a substance called oxalate to form kidney stones.  Reduce the amount of  animal protein you eat if the amount you eat is excessive. Animal protein causes your body to release excess calcium into your urine. Ask your dietitian how much protein from animal sources you should be eating.  Avoid foods that are high in oxalates. If you take vitamins, they should have less than 500 mg of vitamin C. Your body turns vitamin C into oxalates. You do not need to avoid fruits and vegetables high in vitamin C. Calcium Phosphate Kidney Stones  Reduce the amount of salt you eat to help prevent the release of excess calcium into your urine.  Reduce the amount of animal protein you eat if the amount you eat is excessive. Animal  protein causes your body to release excess calcium into your urine. Ask your dietitian how much protein from animal sources you should be eating.  Get enough calcium from food or take a calcium supplement (ask your dietitian for recommendations). Food sources of calcium that do not increase your risk of kidney stones include:  Broccoli.  Dairy products, such as cheese and yogurt.  Pudding. Uric Acid Kidney Stones  Do not have more than 6 oz of animal protein per day. FOOD SOURCES Animal Protein Sources  Meat (all types).  Poultry.  Eggs.  Fish, seafood. Foods High in [employer] seasonings.  Soy sauce.  Teriyaki sauce.  Cured and processed meats.  Salted crackers and snack foods.  Fast food.  Canned soups and most canned foods. Foods High in Oxalates  Grains:  Amaranth.  Barley.  Grits.  Wheat germ.  Bran.  Buckwheat flour.  All bran cereals.  Pretzels.  Whole wheat bread.  Vegetables:  Beans (wax).  Beets and beet greens.  Collard greens.  Eggplant.  [place].  Leeks.  Okra.  Parsley.  Rutabagas.  Spinach.  Swiss chard.  Tomato paste.  Fried potatoes.  Sweet potatoes.  Fruits:  Red currants.  Figs.  Kiwi.  Rhubarb.  Meat and Other Protein Sources:  Beans (dried).  Soy  burgers and other soybean products.  Miso.  Nuts (peanuts, almonds, pecans, cashews, hazelnuts).  Nut butters.  Sesame seeds and tahini (paste made of sesame seeds).  Poppy seeds.  Beverages:  Chocolate drink mixes.  Soy milk.  Instant iced tea.  Juices made from high-oxalate fruits or vegetables.  Other:  Carob.  Chocolate.  Fruitcake.  Marmalades.   This information is not intended to replace advice given to you by your health care provider. Make sure you discuss any questions you have with your health care provider.   Document Released: 09/22/2010 Document Revised: 06/02/2013 Document Reviewed: 04/24/2013 Elsevier Interactive Patient Education [employer].  Lithotripsy Lithotripsy is a treatment that can sometimes help eliminate kidney stones and pain that they cause. A form of lithotripsy, also known as extracorporeal shock wave lithotripsy, is a nonsurgical procedure that helps your body rid itself of the kidney stone when it is too big to pass on its own. Extracorporeal shock wave lithotripsy is a method of crushing a kidney stone with shock waves. These shock waves pass through your body and are focused on your stone. They cause the kidney stones to crumble while still in the urinary tract. It is then easier for the smaller pieces of stone to pass in the urine. Lithotripsy usually takes about an hour. It is done in a hospital, a lithotripsy center, or a mobile unit. It usually does not require an overnight stay. Your health care provider will instruct you on preparation for the procedure. Your health care provider will tell you what to expect afterward. LET [facility] CARE PROVIDER KNOW ABOUT:  Any allergies you have.  All medicines you are taking, including vitamins, herbs, eye drops, creams, and over-the-counter medicines.  Previous problems you or members of your family have had with the use of anesthetics.  Any blood disorders you have.  Previous  surgeries you have had.  Medical conditions you have. RISKS AND COMPLICATIONS Generally, lithotripsy for kidney stones is a safe procedure. However, as with any procedure, complications can occur. Possible complications include:  Infection.  Bleeding of the kidney.  Bruising of the kidney or skin.  Obstruction of the ureter.  Failure of  the stone to fragment. BEFORE THE PROCEDURE  Do not eat or drink for 6-8 hours prior to the procedure. You may, however, take the medications with a sip of water that your physician instructs you to take  Do not take aspirin or aspirin-containing products for 7 days prior to your procedure  Do not take nonsteroidal anti-inflammatory products for 7 days prior to your procedure PROCEDURE A stent (flexible tube with holes) may be placed in your ureter. The ureter is the tube that transports the urine from the kidneys to the bladder. Your health care provider may place a stent before the procedure. This will help keep urine flowing from the kidney if the fragments of the stone block the ureter. You may have an IV tube placed in one of your veins to give you fluids and medicines. These medicines may help you relax or make you sleep. During the procedure, you will lie comfortably on a fluid-filled cushion or in a warm-water bath. After an X-ray or ultrasound exam to locate your stone, shock waves are aimed at the stone. If you are awake, you may feel a tapping sensation as the shock waves pass through your body. If large stone particles remain after treatment, a second procedure may be necessary at a later date. For comfort during the test:  Relax as much as possible.  Try to remain still as much as possible.  Try to follow instructions to speed up the test.  Let your health care provider know if you are uncomfortable, anxious, or in pain. AFTER THE PROCEDURE  After surgery, you will be taken to the recovery area. A nurse will watch and check your  progress. Once you're awake, stable, and taking fluids well, you will be allowed to go home as long as there are no problems. You will also be allowed to pass your urine before discharge.You may be given antibiotics to help prevent infection. You may also be prescribed pain medicine if needed. In a week or two, your health care provider may remove your stent, if you have one. You may first have an X-ray exam to check on how successful the fragmentation of your stone has been and how much of the stone has passed. Your health care provider will check to see whether or not stone particles remain. SEEK IMMEDIATE MEDICAL CARE IF:  You develop a fever or shaking chills.  Your pain is not relieved by medicine.  You feel sick to your stomach (nauseated) and you vomit.  You develop heavy bleeding.  You have difficulty urinating.  You start to pass your stent from your penis.   This information is not intended to replace advice given to you by your health care provider. Make sure you discuss any questions you have with your health care provider.   Document Released: 05/25/2000 Document Revised: 06/18/2014 Document Reviewed: 12/11/2012 Elsevier Interactive Patient Education 2016 Elsevier Inc[employer]  Hematuria, Adult Hematuria is blood in your urine. It can be caused by a bladder infection, kidney infection, prostate infection, kidney stone, or cancer of your urinary tract. Infections can usually be treated with medicine, and a kidney stone usually will pass through your urine. If neither of these is the cause of your hematuria, further workup to find out the reason may be needed. It is very important that you tell your health care provider about any blood you see in your urine, even if the blood stops without treatment or happens without causing pain. Blood in your urine that happens  and then stops and then happens again can be a symptom of a very serious condition. Also, pain is not a symptom in the initial  stages of many urinary cancers. HOME CARE INSTRUCTIONS   Drink lots of fluid, 3-4 quarts a day. If you have been diagnosed with an infection, cranberry juice is especially recommended, in addition to large amounts of water.  Avoid caffeine, tea, and carbonated beverages because they tend to irritate the bladder.  Avoid alcohol because it may irritate the prostate.  Take all medicines as directed by your health care provider.  If you were prescribed an antibiotic medicine, finish it all even if you start to feel better.  If you have been diagnosed with a kidney stone, follow your health care provider's instructions regarding straining your urine to catch the stone.  Empty your bladder often. Avoid holding urine for long periods of time.  After a bowel movement, women should cleanse front to back. Use each tissue only once.  Empty your bladder before and after sexual intercourse if you are a female. SEEK MEDICAL CARE IF:  You develop back pain.  You have a fever.  You have a feeling of sickness in your stomach (nausea) or vomiting.  Your symptoms are not better in 3 days. Return sooner if you are getting worse. SEEK IMMEDIATE MEDICAL CARE IF:   You develop severe vomiting and are unable to keep the medicine down.  You develop severe back or abdominal pain despite taking your medicines.  You begin passing a large amount of blood or clots in your urine.  You feel extremely weak or faint, or you pass out. MAKE SURE YOU:   Understand these instructions.  Will watch your condition.  Will get help right away if you are not doing well or get worse.   This information is not intended to replace advice given to you by your health care provider. Make sure you discuss any questions you have with your health care provider.   Document Released: 05/28/2005 Document Revised: 06/18/2014 Document Reviewed: 01/26/2013 Elsevier Interactive Patient Education [employer].

## 2015-07-05 ENCOUNTER — Telehealth: Payer: Self-pay | Admitting: Urology

## 2015-07-05 NOTE — Telephone Encounter (Signed)
Previous telephone encounter regarding urine color was created in error.

## 2015-07-07 ENCOUNTER — Encounter: Payer: Self-pay | Admitting: Obstetrics and Gynecology

## 2015-07-08 ENCOUNTER — Other Ambulatory Visit: Payer: Self-pay | Admitting: Obstetrics and Gynecology

## 2015-07-08 ENCOUNTER — Ambulatory Visit
Admission: RE | Admit: 2015-07-08 | Discharge: 2015-07-08 | Disposition: A | Discharge status: Home / Self Care | Payer: BLUE CROSS/BLUE SHIELD | Source: Ambulatory Visit | Attending: Obstetrics and Gynecology | Admitting: Obstetrics and Gynecology

## 2015-07-08 DIAGNOSIS — N200 Calculus of kidney: Secondary | ICD-10-CM

## 2015-07-08 DIAGNOSIS — K589 Irritable bowel syndrome without diarrhea: Secondary | ICD-10-CM | POA: Diagnosis not present

## 2015-07-08 DIAGNOSIS — N132 Hydronephrosis with renal and ureteral calculous obstruction: Secondary | ICD-10-CM | POA: Diagnosis not present

## 2015-07-08 DIAGNOSIS — Z9049 Acquired absence of other specified parts of digestive tract: Secondary | ICD-10-CM | POA: Insufficient documentation

## 2015-07-08 DIAGNOSIS — N202 Calculus of kidney with calculus of ureter: Secondary | ICD-10-CM | POA: Diagnosis not present

## 2015-07-15 ENCOUNTER — Ambulatory Visit (INDEPENDENT_AMBULATORY_CARE_PROVIDER_SITE_OTHER): Payer: BLUE CROSS/BLUE SHIELD | Admitting: Urology

## 2015-07-15 VITALS — BP 155/81 | HR 86 | Ht 64.0 in | Wt 246.3 lb

## 2015-07-15 DIAGNOSIS — N200 Calculus of kidney: Secondary | ICD-10-CM

## 2015-07-15 DIAGNOSIS — D3502 Benign neoplasm of left adrenal gland: Secondary | ICD-10-CM

## 2015-07-15 DIAGNOSIS — R310 Gross hematuria: Secondary | ICD-10-CM

## 2015-07-16 ENCOUNTER — Encounter: Payer: Self-pay | Admitting: Urology

## 2015-07-16 NOTE — Progress Notes (Signed)
11:18 AM  07/15/15  Diamond Sosa [birth year] [id number]  Referring provider: Juluis Pitch, MD [address] [place], [place] [address]  Chief Complaint  Patient presents with  . Results    HPI: 60 year old female with bilateral nephrolithiasis. She initially presented after being seen in the emergency room with a 3 mm right UVJ stone on 06/17/2015.  Follow-up renal ultrasound in the interim shows significant improvement in her hydronephrosis on the right and no residual pain and has presumably passed the stone.  She did not see the stone passed but was not consistently straining her urine.  On CT abdomen pelvis obtained in the emergency room, she was noted to have 2 very large left renal stones measuring 1.9 cm and 1.2 cm without evidence of obstruction and returns to the office today discuss management of these.  She denies any recent gross hematuria, dysuria, flank pain. No recent fevers or chills.  No previous history of renal stones prior to the above episode.  More recently, she has developed significant abdominal issues with frequent loose stools.  She is scheduled to see GI.  She will also be seeing her cardiologist in the near future.  Reports that she experienced difficulty waking up from general anesthesia in 2002 and is very anxious about being place under again.   PMH: Past Medical History  Diagnosis Date  . Diabetes mellitus without complication ([facility])   . Hypertension   . Thyroid disease     hypothyroid  . 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 IBS (irritable bowel syndrome)   . Microadenoma ([place])   . Cataracts, bilateral   . Adrenal tumor   . Kidney stone   . Inflammatory bowel disease     Surgical History: Past Surgical History  Procedure Laterality Date  . Cholecystectomy    . Tonsillectomy    . Cervical cerclage      Home Medications:    Medication List       This list is accurate as of: 07/15/15 11:59 PM.  Always use your most recent med list.                cholecalciferol 1000 units tablet  Commonly known as:  VITAMIN D  Take 1,000 Units by mouth daily.     loratadine 10 MG tablet  Commonly known as:  CLARITIN  Take 10 mg by mouth daily as needed for allergies.     metFORMIN 750 MG 24 hr tablet  Commonly known as:  GLUCOPHAGE-XR  Take 750 mg by mouth 2 (two) times daily.     metoprolol succinate 50 MG 24 hr tablet  Commonly known as:  TOPROL-XL  Take 50 mg by mouth daily. Take with or immediately following a meal.        Allergies:  Allergies  Allergen Reactions  . Cefuroxime Axetil     REACTION: GI distress and tongue breaks out  . Erythromycin Base     Family History: Family History  Problem Relation Age of Onset  . Prostate cancer Father   . Urolithiasis Mother   . Kidney disease Neg Hx   . Kidney cancer Neg Hx     Social History:  reports that she has quit smoking. She does not have any smokeless tobacco history on file. She reports that she does not drink alcohol. Her drug history is not on file.  ROS: UROLOGY Frequent Urination?: Yes Hard to postpone urination?: Yes Burning/pain with urination?: No Get up at night to urinate?: Yes Leakage  of urine?: Yes Urine stream starts and stops?: No Trouble starting stream?: No Do you have to strain to urinate?: No Blood in urine?: Yes Urinary tract infection?: No Sexually transmitted disease?: No Injury to kidneys or bladder?: No Painful intercourse?: No Weak stream?: No Currently pregnant?: No Vaginal bleeding?: No Last menstrual period?: n  Gastrointestinal Nausea?: No Vomiting?: No Indigestion/heartburn?: Yes Diarrhea?: Yes Constipation?: No  Constitutional Fever: No Night sweats?: No Weight loss?: No Fatigue?: Yes  Skin Skin rash/lesions?: No Itching?: No  Eyes Blurred vision?: Yes Double vision?: Yes  Ears/Nose/Throat Sore throat?: No Sinus problems?: Yes  Hematologic/Lymphatic Swollen glands?: No Easy bruising?:  No  Cardiovascular Leg swelling?: Yes Chest pain?: No  Respiratory Cough?: No Shortness of breath?: Yes  Endocrine Excessive thirst?: No  Musculoskeletal Back pain?: No Joint pain?: Yes  Neurological Headaches?: No Dizziness?: No  Psychologic Depression?: No Anxiety?: Yes  Physical Exam: BP 155/81 mmHg  Pulse 86  Ht 5\' 4"  (1.626 m)  Wt 246 lb 4.8 oz (111.721 kg)  BMI 42.26 kg/m2  Constitutional:  Alert and oriented, No acute distress. HEENT: [place] AT, moist mucus membranes.  Trachea midline, no masses. Cardiovascular: No clubbing, cyanosis, or edema.  RRR. Respiratory: Normal respiratory effort, no increased work of breathing.  CTAB. GI: Abdomen is soft, nontender, nondistended, no abdominal masses.  Obese. GU: No CVA tenderness.  Skin: No rashes, bruises or suspicious lesions. Neurologic: Grossly intact, no focal deficits, moving all 4 extremities. Psychiatric: Normal mood and affect.  Laboratory Data:   Urinalysis Results for orders placed or performed in visit on 07/04/15  Microscopic Examination  Result Value Ref Range   WBC, UA 6-10 (A) 0 -  5 /hpf   RBC, UA 11-30 (A) 0 -  2 /hpf   Epithelial Cells (non renal) 0-10 0 - 10 /hpf   Mucus, UA Present (A) Not Estab.   Bacteria, UA Many (A) None seen/Few  Urinalysis, Complete  Result Value Ref Range   Specific Gravity, UA 1.020 1.005 - 1.030   pH, UA 5.0 5.0 - 7.5   Color, UA Yellow Yellow   Appearance Ur Clear Clear   Leukocytes, UA 1+ (A) Negative   Protein, UA 2+ (A) Negative/Trace   Glucose, UA Negative Negative   Ketones, UA Negative Negative   RBC, UA 2+ (A) Negative   Bilirubin, UA Negative Negative   Urobilinogen, Ur 0.2 0.2 - 1.0 mg/dL   Nitrite, UA Negative Negative   Microscopic Examination See below:      Pertinent Imaging: CT Stone 06/17/15 CLINICAL DATA: Patient with right lower abdominal pain. Acute onset of symptoms.  EXAM: CT ABDOMEN AND PELVIS WITHOUT  CONTRAST  TECHNIQUE: Multidetector CT imaging of the abdomen and pelvis was performed following the standard protocol without IV contrast.  COMPARISON: None.  FINDINGS: Lower chest: Normal heart size. Dependent atelectasis within the left lung base. No pleural effusion.  Hepatobiliary: Liver is diffusely low in attenuation compatible with hepatic steatosis. Patient status post cholecystectomy.  Pancreas: Unremarkable  Spleen: Unremarkable  Adrenals/Urinary Tract: There is a 3.6 cm low-attenuation mass within the left adrenal gland most compatible with benign adenoma. Right adrenal gland is unremarkable. There is a 1.9 cm nonobstructing stone within the inferior pole of the left kidney. There is an adjacent 1.2 cm nonobstructing stone within the inferior pole of the left kidney. There is moderate right hydroureteronephrosis to the level of the distal right ureter were there is an obstructing 3 mm stone at the right UVJ (image  111; series 5). There is an additional punctate 1- 2 mm stone within the inferior pole of the right kidney.  Stomach/Bowel: Sigmoid colonic diverticulosis. No CT evidence for acute diverticulitis. Marked stranding involving the upper abdominalu mesentery. Stomach is normal morphology. No free fluid or free intraperitoneal air. The appendix is normal.  Vascular/Lymphatic: Normal caliber abdominal aorta. Peripheral calcified atherosclerotic plaque. No retroperitoneal lymphadenopathy.  Other: The uterus and adnexal structures are unremarkable.  Musculoskeletal: No aggressive or acute appearing osseous lesions.  IMPRESSION: There is an obstructing 3 mm stone within the distal right ureter at the UVJ resulting in moderate right hydroureteronephrosis.  Additional nonobstructing stones within the left kidney as described above.  Fat stranding of the upper abdominal mesentery which is nonspecific and may be seen with mesenteric  panniculitis. While not favored, lymphoma is an additional consideration. Consider clinical, laboratory and follow-up imaging evaluation.  Hepatic steatosis.  Electronically Signed  By: Lovey Newcomer M.D.  On: 06/17/2015 19:31   RUS 07/08/15 CLINICAL DATA: Known distal right ureteral stone and left renal calculi  EXAM: RENAL / URINARY TRACT ULTRASOUND COMPLETE  COMPARISON: 06/17/2015  FINDINGS: Right Kidney:  Length: 14 cm. The degree of hydronephrosis when compared with the prior CT has improved significantly. A minimal amount of dilatation is seen.  Left Kidney:  Length: 14.3 cm. Very mild hydronephrosis is noted. Scattered echogenic foci are noted consistent with renal calculi. These are similar to that seen on the prior exam.  Bladder:  Appears normal for degree of bladder distention.  IMPRESSION: Left renal calculi with minimal hydronephrotic change.  Improvement in the degree of right-sided hydronephrosis.   Electronically Signed  By: Inez Catalina M.D.  On: 07/08/2015 14:36  KUB 07/08/15 IMPRESSION: Stable left mid to lower pole kidney stone. A definite ureteral stone is not observed. Correlation with the presence of any persistent hematuria would be useful.    All of the above imaging is personally reviewed today with the patient.  Assessment & Plan:    1. Nephrolithiasis Based on imaging, she has presumably passed or 3 mm distal right stone. Today, we discussed options for management of her large left nonobstructing stone burden.  All imaging reviewed.    SSD 10 cm,  530 HFU.  We discussed various treatment options including ESWL vs. ureteroscopy, laser lithotripsy, and stent (staged procedure) as well as PCNL. We discussed the risks and benefits of each ncluding bleeding, infection, damage to surrounding structures, efficacy with need for possible further intervention, and need for temporary ureteral stent/ nephrostomy tube.     Given the stone burden, I do feel that she would be best served by left PCNL. Specifically, we discussed need for overnight observation, slightly higher risk of bleeding that higher stone free rate with a single procedure.  She would like to proceed with left PCNL. She would like to complete her workup with gastroenterology and see her cardiologist prior to officially booking the procedure.     2. Adrenal adenoma, left Incidental 3.1 cm left adrenal adenoma noted on CT stone protocol.  Patient reports workup by endocrinologist including 24-hour urine presumably as workup for this in the past.    3. Gross hematuria Likely related to urinary tract infection per patient report versus stones. We will address stones and repeat UA after she is stone free to assess for residual microscopic hematuria or recurrent gross hematuria. We'll defer bilateral delayed ureteral imaging/cystoscopy until the aforementioned issues are addressed as this is the most likely cause.  Will schedule L PCNL when patient calls after GI work up complete.  If surgery not booked within 3 months, would like to see patient back in the office prior to booking the procedure.    Hollice Espy, MD   [facility] [address], [place], [place] [address] [phone]  I spent 25 min with this patient of which greater than 50% was spent in counseling and coordination of care with the patient.   Imaging was reviewed in detail and all the patient's questions were answered.

## 2015-07-18 ENCOUNTER — Ambulatory Visit: Payer: BLUE CROSS/BLUE SHIELD | Admitting: Obstetrics and Gynecology

## 2015-08-04 ENCOUNTER — Other Ambulatory Visit
Admission: RE | Admit: 2015-08-04 | Discharge: 2015-08-04 | Disposition: A | Discharge status: Home / Self Care | Payer: BLUE CROSS/BLUE SHIELD | Source: Ambulatory Visit | Attending: Gastroenterology | Admitting: Gastroenterology

## 2015-08-04 DIAGNOSIS — K529 Noninfective gastroenteritis and colitis, unspecified: Secondary | ICD-10-CM | POA: Diagnosis not present

## 2015-08-04 LAB — GASTROINTESTINAL PANEL BY PCR, STOOL (REPLACES STOOL CULTURE)
ASTROVIRUS: NOT DETECTED
Adenovirus F40/41: NOT DETECTED
CYCLOSPORA CAYETANENSIS: NOT DETECTED
Campylobacter species: NOT DETECTED
Cryptosporidium: NOT DETECTED
E. COLI O157: NOT DETECTED
ENTAMOEBA HISTOLYTICA: NOT DETECTED
ENTEROTOXIGENIC E COLI (ETEC): NOT DETECTED
Enteroaggregative E coli (EAEC): NOT DETECTED
Enteropathogenic E coli (EPEC): NOT DETECTED
GIARDIA LAMBLIA: NOT DETECTED
Norovirus GI/GII: NOT DETECTED
Plesimonas shigelloides: NOT DETECTED
Rotavirus A: NOT DETECTED
SALMONELLA SPECIES: NOT DETECTED
SAPOVIRUS (I, II, IV, AND V): NOT DETECTED
Shiga like toxin producing E coli (STEC): NOT DETECTED
Shigella/Enteroinvasive E coli (EIEC): NOT DETECTED
VIBRIO CHOLERAE: NOT DETECTED
VIBRIO SPECIES: NOT DETECTED
Yersinia enterocolitica: NOT DETECTED

## 2015-08-04 LAB — C DIFFICILE QUICK SCREEN W PCR REFLEX
C Diff antigen: NEGATIVE
C Diff interpretation: NEGATIVE
C Diff toxin: NEGATIVE

## 2015-09-12 ENCOUNTER — Encounter: Payer: Self-pay | Admitting: *Deleted

## 2015-09-13 ENCOUNTER — Ambulatory Visit: Payer: BLUE CROSS/BLUE SHIELD | Admitting: Anesthesiology

## 2015-09-13 ENCOUNTER — Encounter
Admission: RE | Disposition: A | Discharge status: Home / Self Care | Payer: Self-pay | Source: Ambulatory Visit | Attending: Gastroenterology

## 2015-09-13 ENCOUNTER — Ambulatory Visit
Admission: RE | Admit: 2015-09-13 | Discharge: 2015-09-13 | Disposition: A | Discharge status: Home / Self Care | Payer: BLUE CROSS/BLUE SHIELD | Source: Ambulatory Visit | Attending: Gastroenterology | Admitting: Gastroenterology

## 2015-09-13 DIAGNOSIS — Z888 Allergy status to other drugs, medicaments and biological substances status: Secondary | ICD-10-CM | POA: Diagnosis not present

## 2015-09-13 DIAGNOSIS — Z6841 Body Mass Index (BMI) 40.0 and over, adult: Secondary | ICD-10-CM | POA: Diagnosis not present

## 2015-09-13 DIAGNOSIS — Z91030 Bee allergy status: Secondary | ICD-10-CM | POA: Insufficient documentation

## 2015-09-13 DIAGNOSIS — F419 Anxiety disorder, unspecified: Secondary | ICD-10-CM | POA: Diagnosis not present

## 2015-09-13 DIAGNOSIS — Z79899 Other long term (current) drug therapy: Secondary | ICD-10-CM | POA: Diagnosis not present

## 2015-09-13 DIAGNOSIS — I10 Essential (primary) hypertension: Secondary | ICD-10-CM | POA: Diagnosis not present

## 2015-09-13 DIAGNOSIS — I499 Cardiac arrhythmia, unspecified: Secondary | ICD-10-CM | POA: Diagnosis not present

## 2015-09-13 DIAGNOSIS — Z881 Allergy status to other antibiotic agents status: Secondary | ICD-10-CM | POA: Insufficient documentation

## 2015-09-13 DIAGNOSIS — E039 Hypothyroidism, unspecified: Secondary | ICD-10-CM | POA: Insufficient documentation

## 2015-09-13 DIAGNOSIS — D125 Benign neoplasm of sigmoid colon: Secondary | ICD-10-CM | POA: Diagnosis not present

## 2015-09-13 DIAGNOSIS — Z87442 Personal history of urinary calculi: Secondary | ICD-10-CM | POA: Insufficient documentation

## 2015-09-13 DIAGNOSIS — K621 Rectal polyp: Secondary | ICD-10-CM | POA: Insufficient documentation

## 2015-09-13 DIAGNOSIS — E119 Type 2 diabetes mellitus without complications: Secondary | ICD-10-CM | POA: Diagnosis not present

## 2015-09-13 DIAGNOSIS — K5730 Diverticulosis of large intestine without perforation or abscess without bleeding: Secondary | ICD-10-CM | POA: Insufficient documentation

## 2015-09-13 DIAGNOSIS — R0602 Shortness of breath: Secondary | ICD-10-CM | POA: Insufficient documentation

## 2015-09-13 DIAGNOSIS — K589 Irritable bowel syndrome without diarrhea: Secondary | ICD-10-CM | POA: Diagnosis not present

## 2015-09-13 DIAGNOSIS — R197 Diarrhea, unspecified: Secondary | ICD-10-CM | POA: Diagnosis not present

## 2015-09-13 DIAGNOSIS — M1990 Unspecified osteoarthritis, unspecified site: Secondary | ICD-10-CM | POA: Diagnosis not present

## 2015-09-13 HISTORY — DX: Anxiety disorder, unspecified: F41.9

## 2015-09-13 HISTORY — DX: Unspecified osteoarthritis, unspecified site: M19.90

## 2015-09-13 HISTORY — DX: Adverse effect of unspecified anesthetic, initial encounter: T41.45XA

## 2015-09-13 HISTORY — DX: Hypothyroidism, unspecified: E03.9

## 2015-09-13 HISTORY — DX: Cardiac arrhythmia, unspecified: I49.9

## 2015-09-13 HISTORY — PX: COLONOSCOPY WITH PROPOFOL: SHX5780

## 2015-09-13 HISTORY — DX: Reserved for inherently not codable concepts without codable children: IMO0001

## 2015-09-13 LAB — GLUCOSE, CAPILLARY: Glucose-Capillary: 194 mg/dL — ABNORMAL HIGH (ref 65–99)

## 2015-09-13 SURGERY — COLONOSCOPY WITH PROPOFOL
Anesthesia: General

## 2015-09-13 MED ORDER — MIDAZOLAM HCL 5 MG/5ML IJ SOLN
INTRAMUSCULAR | Status: DC | PRN
  Administered 2015-09-13: 2 mg via INTRAVENOUS

## 2015-09-13 MED ORDER — PROPOFOL 500 MG/50ML IV EMUL
INTRAVENOUS | Status: DC | PRN
  Administered 2015-09-13: 140 ug/kg/min via INTRAVENOUS

## 2015-09-13 MED ORDER — SODIUM CHLORIDE 0.9 % IV SOLN
INTRAVENOUS | Status: DC
Start: 2015-09-13 — End: 2015-09-13
  Administered 2015-09-13: 1000 mL via INTRAVENOUS
  Administered 2015-09-13: 09:00:00 via INTRAVENOUS

## 2015-09-13 MED ORDER — EPHEDRINE SULFATE 50 MG/ML IJ SOLN
INTRAMUSCULAR | Status: DC | PRN
  Administered 2015-09-13: 10 mg via INTRAVENOUS

## 2015-09-13 MED ORDER — PROPOFOL 10 MG/ML IV BOLUS
INTRAVENOUS | Status: DC | PRN
  Administered 2015-09-13: 10 mg via INTRAVENOUS
  Administered 2015-09-13: 20 mg via INTRAVENOUS
  Administered 2015-09-13: 40 mg via INTRAVENOUS

## 2015-09-13 MED ORDER — PHENYLEPHRINE HCL 10 MG/ML IJ SOLN
INTRAMUSCULAR | Status: DC | PRN
  Administered 2015-09-13: 100 ug via INTRAVENOUS

## 2015-09-13 MED ORDER — FENTANYL CITRATE (PF) 100 MCG/2ML IJ SOLN
INTRAMUSCULAR | Status: DC | PRN
  Administered 2015-09-13: 50 ug via INTRAVENOUS

## 2015-09-13 MED ORDER — SODIUM CHLORIDE 0.9 % IV SOLN: INTRAVENOUS | Status: DC

## 2015-09-13 NOTE — Anesthesia Preprocedure Evaluation (Signed)
Anesthesia Evaluation  Patient identified by MRN, date of birth, ID band Patient awake    Reviewed: Allergy & Precautions, H&P , NPO status , Patient's Chart, lab work & pertinent test results, reviewed documented beta blocker date and time   History of Anesthesia Complications (+) AWARENESS UNDER ANESTHESIA and history of anesthetic complications  Airway Mallampati: III  TM Distance: >3 FB Neck ROM: full  Mouth opening: Limited Mouth Opening  Dental no notable dental hx. (+) Missing, Teeth Intact   Pulmonary shortness of breath and with exertion, neg sleep apnea, neg COPD, Recent URI , Residual Cough, former smoker,    Pulmonary exam normal breath sounds clear to auscultation       Cardiovascular Exercise Tolerance: Good hypertension, On Medications and On Home Beta Blockers (-) angina+ Peripheral Vascular Disease  (-) CAD, (-) Past MI, (-) Cardiac Stents and (-) CABG Normal cardiovascular exam(-) dysrhythmias (-) Valvular Problems/Murmurs Rhythm:regular Rate:Normal     Neuro/Psych negative neurological ROS  negative psych ROS   GI/Hepatic Neg liver ROS, GERD  ,  Endo/Other  diabetes, Oral Hypoglycemic AgentsHypothyroidism Morbid obesity  Renal/GU Renal disease (kidney stones)  negative genitourinary   Musculoskeletal   Abdominal   Peds  Hematology negative hematology ROS (+)   Anesthesia Other Findings Past Medical History:   Diabetes mellitus without complication ([facility])                 Hypertension                                                 Thyroid disease                                                Comment:hypothyroid   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[facility])                                        IBS (irritable bowel syndrome)                               Microadenoma ([facility])                                           Cataracts, bilateral                                         Adrenal tumor                                                 Kidney stone  Inflammatory bowel disease                                   Complication of anesthesia                                   Shortness of breath dyspnea                                  Dysrhythmia                                                  Hypothyroidism                                               Anxiety                                                      Arthritis                                                    Reproductive/Obstetrics negative OB ROS                             Anesthesia Physical Anesthesia Plan  ASA: III  Anesthesia Plan: General   Post-op Pain Management:    Induction:   Airway Management Planned:   Additional Equipment:   Intra-op Plan:   Post-operative Plan:   Informed Consent: I have reviewed the patients History and Physical, chart, labs and discussed the procedure including the risks, benefits and alternatives for the proposed anesthesia with the patient or authorized representative who has indicated his/her understanding and acceptance.   Dental Advisory Given  Plan Discussed with: Anesthesiologist, CRNA and Surgeon  Anesthesia Plan Comments:         Anesthesia Quick Evaluation

## 2015-09-13 NOTE — H&P (Signed)
Outpatient short stay form Pre-procedure 09/13/2015 9:08 AM Lollie Sails MD  Primary Physician: Charlestine Massed  M.D.  Reason for visit:  Colonoscopy  History of present illness:  Patient is a 60 year old female presenting today in regards to issues of diarrhea. She's had chronic problems with diarrhea and has been taking some Colestid she is also been diagnosed with irritable bowel syndrome. She had her cholecystectomy a number of years ago. She denies any blood in her stools. She takes no aspirin or blood thinning products. She tolerated her prep well.    Current facility-administered medications:  .  0.9 %  sodium chloride infusion, , Intravenous, Continuous, Lollie Sails, MD, Last Rate: 20 mL/hr at 09/13/15 0821, 1,000 mL at 09/13/15 0821 .  0.9 %  sodium chloride infusion, , Intravenous, Continuous, Lollie Sails, MD  Prescriptions prior to admission  Medication Sig Dispense Refill Last Dose  . metoprolol succinate (TOPROL-XL) 50 MG 24 hr tablet Take 50 mg by mouth daily. Take with or immediately following a meal.   09/12/2015 at 1400  . cholecalciferol (VITAMIN D) 1000 UNITS tablet Take 1,000 Units by mouth daily.   Taking  . loratadine (CLARITIN) 10 MG tablet Take 10 mg by mouth daily as needed for allergies.   Taking  . metFORMIN (GLUCOPHAGE-XR) 750 MG 24 hr tablet Take 750 mg by mouth 2 (two) times daily.   Taking     Allergies  Allergen Reactions  . Bee Venom Shortness Of Breath  . Cefuroxime Axetil     REACTION: GI distress and tongue breaks out  . Erythromycin Base      Past Medical History  Diagnosis Date  . Diabetes mellitus without complication ([facility])   . Hypertension   . Thyroid disease     hypothyroid  . 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 IBS (irritable bowel syndrome)   . Microadenoma ([facility])   . Cataracts, bilateral   . Adrenal tumor   . Kidney stone   . Inflammatory bowel disease   . Complication of anesthesia   . Shortness of breath dyspnea   .  Dysrhythmia   . Hypothyroidism   . Anxiety   . Arthritis     Review of systems:      Physical Exam    Heart and lungs: Regular rate and rhythm without rub or gallop, lungs are bilaterally clear.    HEENT: Normocephalic atraumatic eyes are anicteric    Other:     Pertinant exam for procedure: Soft nontender nondistended bowel sounds positive and normoactive.    Planned proceedures: Colonoscopy and indicated procedures. I have discussed the risks benefits and complications of procedures to include not limited to bleeding, infection, perforation and the risk of sedation and the patient wishes to proceed.    Lollie Sails, MD Gastroenterology 09/13/2015  9:08 AM

## 2015-09-13 NOTE — Transfer of Care (Signed)
Immediate Anesthesia Transfer of Care Note  Patient: Diamond Sosa  Procedure(s) Performed: Procedure(s): COLONOSCOPY WITH PROPOFOL (N/A)  Patient Location: PACU  Anesthesia Type:General  Level of Consciousness: awake  Airway & Oxygen Therapy: Patient Spontanous Breathing and Patient connected to nasal cannula oxygen  Post-op Assessment: Report given to RN and Post -op Vital signs reviewed and stable  Post vital signs: Reviewed and stable  Last Vitals:  Filed Vitals:   09/13/15 1004 09/13/15 1005  BP:  104/54  Pulse:  92  Temp: 35.7 C 35.7 C  Resp:  20    Complications: No apparent anesthesia complications

## 2015-09-13 NOTE — Op Note (Signed)
[facility] Patient Name: Diamond Sosa Procedure Date: 09/13/2015 8:57 AM MRN: [id number] Account #: [id number] Date of Birth: Sep 29, 1955 Admit Type: Outpatient Age: 60 Room: [facility] ENDO ROOM 3 Gender: Female Note Status: Finalized Procedure:            Colonoscopy Indications:          Chronic diarrhea Providers:            Lollie Sails, MD Referring MD:         Youlanda Roys. Lovie Macadamia, MD (Referring MD) Medicines:            Monitored Anesthesia Care Complications:        No immediate complications. Procedure:            Pre-Anesthesia Assessment:                       - ASA Grade Assessment: II - A patient with mild                        systemic disease.                       After obtaining informed consent, the colonoscope was                        passed under direct vision. Throughout the procedure,                        the patient's blood pressure, pulse, and oxygen                        saturations were monitored continuously. The                        Colonoscope was introduced through the anus and                        advanced to the the cecum, identified by appendiceal                        orifice and ileocecal valve. The quality of the bowel                        preparation was fair. The colonoscopy was performed                        without difficulty. The patient tolerated the procedure                        well. Findings:      Multiple medium-mouthed diverticula were found in the sigmoid colon and       descending colon.      A 4 mm polyp was found in the proximal sigmoid colon. The polyp was       semi-pedunculated. The polyp was removed with a cold snare. Resection       and retrieval were complete.      Three sessile polyps were found in the rectum. The polyps were 2 to 3 mm       in size. These polyps were removed with a cold biopsy forceps. Resection       and  retrieval were complete.      Biopsies for  histology were taken with a cold forceps from the right       colon and left colon for evaluation of microscopic colitis.      The retroflexed view of the distal rectum and anal verge was normal and       showed no anal or rectal abnormalities.      The digital rectal exam was normal. Impression:           - Preparation of the colon was fair.                       - Diverticulosis in the sigmoid colon and in the                        descending colon.                       - One 4 mm polyp in the proximal sigmoid colon, removed                        with a cold snare. Resected and retrieved.                       - Three 2 to 3 mm polyps in the rectum, removed with a                        cold biopsy forceps. Resected and retrieved.                       - The distal rectum and anal verge are normal on                        retroflexion view.                       - Biopsies were taken with a cold forceps from the                        right colon and left colon for evaluation of                        microscopic colitis. Recommendation:       - Discharge patient to home.                       - Return to GI clinic in 1 month. Procedure Code(s):    --- Professional ---                       [phone], Colonoscopy, flexible; with removal of tumor(s),                        polyp(s), or other lesion(s) by snare technique                       [id number], 44, Colonoscopy, flexible; with biopsy, single                        or multiple Diagnosis Code(s):    --- Professional ---  D12.5, Benign neoplasm of sigmoid colon                       K62.1, Rectal polyp                       K52.9, Noninfective gastroenteritis and colitis,                        unspecified                       K57.30, Diverticulosis of large intestine without                        perforation or abscess without bleeding CPT copyright 2016 American Medical Association. All rights reserved. The  codes documented in this report are preliminary and upon coder review may  be revised to meet current compliance requirements. Lollie Sails, MD 09/13/2015 10:04:38 AM This report has been signed electronically. Number of Addenda: 0 Note Initiated On: 09/13/2015 8:57 AM Scope Withdrawal Time: 0 hours 19 minutes 45 seconds  Total Procedure Duration: 0 hours 34 minutes 54 seconds       [facility]

## 2015-09-13 NOTE — Anesthesia Procedure Notes (Signed)
Date/Time: 09/13/2015 9:05 AM Performed by: Allean Found Pre-anesthesia Checklist: Patient identified, Emergency Drugs available, Suction available, Patient being monitored and Timeout performed Patient Re-evaluated:Patient Re-evaluated prior to inductionOxygen Delivery Method: Nasal cannula Intubation Type: IV induction

## 2015-09-13 NOTE — Anesthesia Postprocedure Evaluation (Signed)
Anesthesia Post Note  Patient: Diamond Sosa  Procedure(s) Performed: Procedure(s) (LRB): COLONOSCOPY WITH PROPOFOL (N/A)  Patient location during evaluation: Endoscopy Anesthesia Type: General Level of consciousness: awake and alert Pain management: pain level controlled Vital Signs Assessment: post-procedure vital signs reviewed and stable Respiratory status: spontaneous breathing, nonlabored ventilation, respiratory function stable and patient connected to nasal cannula oxygen Cardiovascular status: blood pressure returned to baseline and stable Postop Assessment: no signs of nausea or vomiting Anesthetic complications: no    Last Vitals:  Filed Vitals:   09/13/15 1030 09/13/15 1036  BP: 128/66 128/66  Pulse: 79 82  Temp:    Resp: 12 18    Last Pain: There were no vitals filed for this visit.               Martha Clan

## 2015-09-14 ENCOUNTER — Encounter: Payer: Self-pay | Admitting: Gastroenterology

## 2015-09-14 LAB — SURGICAL PATHOLOGY

## 2015-10-12 ENCOUNTER — Ambulatory Visit (INDEPENDENT_AMBULATORY_CARE_PROVIDER_SITE_OTHER): Payer: BLUE CROSS/BLUE SHIELD | Admitting: Urology

## 2015-10-12 ENCOUNTER — Encounter: Payer: Self-pay | Admitting: Urology

## 2015-10-12 VITALS — BP 129/78 | HR 80 | Ht 63.0 in | Wt 247.7 lb

## 2015-10-12 DIAGNOSIS — D3502 Benign neoplasm of left adrenal gland: Secondary | ICD-10-CM | POA: Diagnosis not present

## 2015-10-12 DIAGNOSIS — N200 Calculus of kidney: Secondary | ICD-10-CM

## 2015-10-12 DIAGNOSIS — R310 Gross hematuria: Secondary | ICD-10-CM | POA: Diagnosis not present

## 2015-10-12 LAB — MICROSCOPIC EXAMINATION
Bacteria, UA: NONE SEEN
RBC, UA: >30 /hpf — AB (ref 0–?)

## 2015-10-12 LAB — URINALYSIS, COMPLETE
BILIRUBIN UA: NEGATIVE
Glucose, UA: NEGATIVE
KETONES UA: NEGATIVE
Nitrite, UA: NEGATIVE
PH UA: 5 (ref 5.0–7.5)
SPEC GRAV UA: 1.02 (ref 1.005–1.030)
UUROB: 0.2 mg/dL (ref 0.2–1.0)

## 2015-10-12 NOTE — Progress Notes (Signed)
1:23 PM  10/12/2015  Diamond Sosa Oct 21, 1955 [id number]  Referring provider: Juluis Pitch, MD [address] [place], [place] [address]  Chief Complaint  Patient presents with  . Follow-up    renal stone, pt needs to be scheduled for PCNL    HPI: 60 year old female with bilateral nephrolithiasis. She initially presented after being seen in the emergency room with a 3 mm right UVJ stone on 06/17/2015.  Follow-up renal ultrasound in the interim shows significant improvement in her hydronephrosis on the right and no residual pain and has presumably passed the stone.  She did not see the stone passed but was not consistently straining her urine.  On CT abdomen pelvis obtained in the emergency room, she was noted to have 2 very large left renal stones measuring 1.9 cm and 1.2 cm without evidence of obstruction and returns to the office today discuss management of these.  She denies any recent gross hematuria, dysuria, flank pain. No recent fevers or chills.    No previous history of renal stones prior to the above episode.  Reports that she experienced difficulty waking up from general anesthesia in 2002 and is very anxious about being place under again.    She returns to the office today after completing her GI work up Now feeling ready for her kidney stone surgery.    PMH: Past Medical History  Diagnosis Date  . Diabetes mellitus without complication ([place])   . Hypertension   . Thyroid disease     hypothyroid  . 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 IBS (irritable bowel syndrome)   . Microadenoma ([place])   . Cataracts, bilateral   . Adrenal tumor   . Kidney stone   . Inflammatory bowel disease   . Complication of anesthesia   . Shortness of breath dyspnea   . Dysrhythmia   . Hypothyroidism   . Anxiety   . Arthritis     Surgical History: Past Surgical History  Procedure Laterality Date  . Cholecystectomy    . Tonsillectomy    . Cervical cerclage    . Colonoscopy  with propofol N/A 09/13/2015    Procedure: COLONOSCOPY WITH PROPOFOL;  Surgeon: Lollie Sails, MD;  Location: [facility] ENDOSCOPY;  Service: Endoscopy;  Laterality: N/A;    Home Medications:    Medication List       This list is accurate as of: 10/12/15  1:22 PM.  Always use your most recent med list.               cholecalciferol 1000 units tablet  Commonly known as:  VITAMIN D  Take 1,000 Units by mouth daily.     colestipol 1 g tablet  Commonly known as:  COLESTID  Take by mouth. Reported on 10/12/2015     loratadine 10 MG tablet  Commonly known as:  CLARITIN  Take 10 mg by mouth daily as needed for allergies.     metFORMIN 750 MG 24 hr tablet  Commonly known as:  GLUCOPHAGE-XR  Take 750 mg by mouth 2 (two) times daily.     metoprolol succinate 50 MG 24 hr tablet  Commonly known as:  TOPROL-XL  Take 50 mg by mouth daily. Take with or immediately following a meal.        Allergies:  Allergies  Allergen Reactions  . Bee Venom Shortness Of Breath  . Cefuroxime Axetil     REACTION: GI distress and tongue breaks out  . Erythromycin Base  Family History: Family History  Problem Relation Age of Onset  . Prostate cancer Father   . Urolithiasis Mother   . Kidney disease Neg Hx   . Kidney cancer Neg Hx     Social History:  reports that she quit smoking about 15 years ago. She does not have any smokeless tobacco history on file. She reports that she does not drink alcohol or use illicit drugs.  ROS: UROLOGY Frequent Urination?: No Hard to postpone urination?: Yes Burning/pain with urination?: No Get up at night to urinate?: No Leakage of urine?: Yes Urine stream starts and stops?: No Trouble starting stream?: No Do you have to strain to urinate?: No Blood in urine?: No Urinary tract infection?: No Sexually transmitted disease?: No Injury to kidneys or bladder?: No Painful intercourse?: No Weak stream?: No Currently pregnant?: No Vaginal bleeding?:  No Last menstrual period?: n  Gastrointestinal Nausea?: No Vomiting?: No Indigestion/heartburn?: Yes Diarrhea?: Yes Constipation?: No  Constitutional Fever: No Night sweats?: No Weight loss?: No Fatigue?: Yes  Skin Skin rash/lesions?: No Itching?: No  Eyes Blurred vision?: Yes Double vision?: Yes  Ears/Nose/Throat Sore throat?: Yes Sinus problems?: Yes  Hematologic/Lymphatic Swollen glands?: No Easy bruising?: No  Cardiovascular Leg swelling?: Yes Chest pain?: Yes  Respiratory Cough?: No Shortness of breath?: Yes  Endocrine Excessive thirst?: No  Musculoskeletal Back pain?: Yes Joint pain?: Yes  Neurological Headaches?: No Dizziness?: No  Psychologic Depression?: No Anxiety?: Yes  Physical Exam: BP 129/78 mmHg  Pulse 80  Ht 5\' 3"  (1.6 m)  Wt 247 lb 11.2 oz (112.356 kg)  BMI 43.89 kg/m2  Constitutional:  Alert and oriented, No acute distress. HEENT:  AT, moist mucus membranes.  Trachea midline, no masses. Cardiovascular: No clubbing, cyanosis, or edema.  RRR. Respiratory: Normal respiratory effort, no increased work of breathing.  CTAB. GI: Abdomen is soft, nontender, nondistended, no abdominal masses.  Obese. GU: No CVA tenderness.  Skin: No rashes, bruises or suspicious lesions. Neurologic: Grossly intact, no focal deficits, moving all 4 extremities. Psychiatric: Normal mood and affect.  Laboratory Data:   Urinalysis Results for orders placed or performed in visit on 10/12/15  Microscopic Examination  Result Value Ref Range   WBC, UA 11-30 (A) 0 -  5 /hpf   RBC, UA >30 (A) 0 -  2 /hpf   Epithelial Cells (non renal) 0-10 0 - 10 /hpf   Mucus, UA Present (A) Not Estab.   Bacteria, UA None seen None seen/Few  Urinalysis, Complete  Result Value Ref Range   Specific Gravity, UA 1.020 1.005 - 1.030   pH, UA 5.0 5.0 - 7.5   Color, UA Yellow Yellow   Appearance Ur Hazy (A) Clear   Leukocytes, UA 1+ (A) Negative   Protein, UA Trace  (A) Negative/Trace   Glucose, UA Negative Negative   Ketones, UA Negative Negative   RBC, UA 3+ (A) Negative   Bilirubin, UA Negative Negative   Urobilinogen, Ur 0.2 0.2 - 1.0 mg/dL   Nitrite, UA Negative Negative   Microscopic Examination See below:      Pertinent Imaging: CT Stone 06/17/15 CLINICAL DATA: Patient with right lower abdominal pain. Acute onset of symptoms.  EXAM: CT ABDOMEN AND PELVIS WITHOUT CONTRAST  TECHNIQUE: Multidetector CT imaging of the abdomen and pelvis was performed following the standard protocol without IV contrast.  COMPARISON: None.  FINDINGS: Lower chest: Normal heart size. Dependent atelectasis within the left lung base. No pleural effusion.  Hepatobiliary: Liver is diffusely low in attenuation  compatible with hepatic steatosis. Patient status post cholecystectomy.  Pancreas: Unremarkable  Spleen: Unremarkable  Adrenals/Urinary Tract: There is a 3.6 cm low-attenuation mass within the left adrenal gland most compatible with benign adenoma. Right adrenal gland is unremarkable. There is a 1.9 cm nonobstructing stone within the inferior pole of the left kidney. There is an adjacent 1.2 cm nonobstructing stone within the inferior pole of the left kidney. There is moderate right hydroureteronephrosis to the level of the distal right ureter were there is an obstructing 3 mm stone at the right UVJ (image 111; series 5). There is an additional punctate 1- 2 mm stone within the inferior pole of the right kidney.  Stomach/Bowel: Sigmoid colonic diverticulosis. No CT evidence for acute diverticulitis. Marked stranding involving the upper abdominalu mesentery. Stomach is normal morphology. No free fluid or free intraperitoneal air. The appendix is normal.  Vascular/Lymphatic: Normal caliber abdominal aorta. Peripheral calcified atherosclerotic plaque. No retroperitoneal lymphadenopathy.  Other: The uterus and adnexal structures are  unremarkable.  Musculoskeletal: No aggressive or acute appearing osseous lesions.  IMPRESSION: There is an obstructing 3 mm stone within the distal right ureter at the UVJ resulting in moderate right hydroureteronephrosis.  Additional nonobstructing stones within the left kidney as described above.  Fat stranding of the upper abdominal mesentery which is nonspecific and may be seen with mesenteric panniculitis. While not favored, lymphoma is an additional consideration. Consider clinical, laboratory and follow-up imaging evaluation.  Hepatic steatosis.  Electronically Signed  By: Lovey Newcomer M.D.  On: 06/17/2015 19:31   RUS 07/08/15 CLINICAL DATA: Known distal right ureteral stone and left renal calculi  EXAM: RENAL / URINARY TRACT ULTRASOUND COMPLETE  COMPARISON: 06/17/2015  FINDINGS: Right Kidney:  Length: 14 cm. The degree of hydronephrosis when compared with the prior CT has improved significantly. A minimal amount of dilatation is seen.  Left Kidney:  Length: 14.3 cm. Very mild hydronephrosis is noted. Scattered echogenic foci are noted consistent with renal calculi. These are similar to that seen on the prior exam.  Bladder:  Appears normal for degree of bladder distention.  IMPRESSION: Left renal calculi with minimal hydronephrotic change.  Improvement in the degree of right-sided hydronephrosis.   Electronically Signed  By: Inez Catalina M.D.  On: 07/08/2015 14:36  KUB 07/08/15 IMPRESSION: Stable left mid to lower pole kidney stone. A definite ureteral stone is not observed. Correlation with the presence of any persistent hematuria would be useful.    All of the above imaging is personally reviewed today with the patient.  Assessment & Plan:    1. Nephrolithiasis Based on imaging, she has presumably passed or 3 mm distal right stone. Today, we discussed options for management of her large left nonobstructing stone  burden.  All imaging reviewed.    SSD 10 cm,  530 HFU.  We discussed various treatment options including ESWL vs. ureteroscopy, laser lithotripsy, and stent (staged procedure) as well as PCNL. We discussed the risks and benefits of each ncluding bleeding, infection, damage to surrounding structures, efficacy with need for possible further intervention, and need for temporary ureteral stent/ nephrostomy tube.    Given the stone burden, I do feel that she would be best served by left PCNL. Specifically, we discussed need for overnight observation, slightly higher risk of bleeding that higher stone free rate with a single procedure.  She would like to proceed with left PCNL as previously discussed. She is now prepared to proceed. She is interested in seeing her cardiologist before the procedure to  minimize her risks. She is very anxious about anesthesia.     2. Adrenal adenoma, left Incidental 3.1 cm left adrenal adenoma noted on CT stone protocol.  Patient reports workup by endocrinologist including 24-hour urine presumably as workup for this in the past.    3. Gross hematuria Likely related to urinary tract infection per patient report versus stones.  No recurrence since last year. We will address stones and repeat UA after she is stone free to assess for residual microscopic hematuria or recurrent gross hematuria. We'll defer bilateral delayed ureteral imaging/cystoscopy until the aforementioned issues are addressed as this is the most likely cause.    Will schedule L PCNL.  Hollice Espy, MD   [facility] [address] [place], [place] [address] [phone]

## 2015-10-15 LAB — CULTURE, URINE COMPREHENSIVE

## 2015-10-19 ENCOUNTER — Telehealth: Payer: Self-pay | Admitting: Radiology

## 2015-10-19 NOTE — Telephone Encounter (Signed)
No answer-- unable to leave message.

## 2015-10-19 NOTE — Telephone Encounter (Signed)
LMOM. Need to speak to pt regarding scheduling left PCNL.

## 2015-10-24 ENCOUNTER — Other Ambulatory Visit: Payer: Self-pay | Admitting: Radiology

## 2015-10-24 DIAGNOSIS — N200 Calculus of kidney: Secondary | ICD-10-CM

## 2015-10-24 NOTE — Telephone Encounter (Signed)
Notified pt of cardiac clearance appt with Dr Clayborn Bigness on 10/26/15 @9 :00. Pt states she would like to call back after she knows her schedule before scheduling surgery.

## 2015-11-02 NOTE — Telephone Encounter (Signed)
LMOM. Need to notify pt of surgery information. 

## 2015-11-03 ENCOUNTER — Other Ambulatory Visit: Payer: No Typology Code available for payment source

## 2015-11-10 NOTE — Telephone Encounter (Signed)
LMOM

## 2015-11-14 ENCOUNTER — Ambulatory Visit: Payer: No Typology Code available for payment source

## 2015-11-16 ENCOUNTER — Encounter
Admission: RE | Admit: 2015-11-16 | Discharge: 2015-11-16 | Disposition: A | Discharge status: Home / Self Care | Payer: BLUE CROSS/BLUE SHIELD | Source: Ambulatory Visit | Attending: Urology | Admitting: Urology

## 2015-11-16 DIAGNOSIS — Z01812 Encounter for preprocedural laboratory examination: Secondary | ICD-10-CM | POA: Insufficient documentation

## 2015-11-16 LAB — PROTIME-INR
INR: 1
Prothrombin Time: 13.4 seconds (ref 11.4–15.0)

## 2015-11-16 LAB — CBC
HCT: 39 % (ref 35.0–47.0)
Hemoglobin: 13.3 g/dL (ref 12.0–16.0)
MCH: 29.3 pg (ref 26.0–34.0)
MCHC: 34.1 g/dL (ref 32.0–36.0)
MCV: 86.1 fL (ref 80.0–100.0)
PLATELETS: 324 10*3/uL (ref 150–440)
RBC: 4.53 MIL/uL (ref 3.80–5.20)
RDW: 13.8 % (ref 11.5–14.5)
WBC: 10.4 10*3/uL (ref 3.6–11.0)

## 2015-11-16 LAB — TYPE AND SCREEN
ABO/RH(D): A POS
Antibody Screen: NEGATIVE

## 2015-11-16 LAB — BASIC METABOLIC PANEL
Anion gap: 9 (ref 5–15)
BUN: 11 mg/dL (ref 6–20)
CO2: 24 mmol/L (ref 22–32)
CREATININE: 0.65 mg/dL (ref 0.44–1.00)
Calcium: 8.9 mg/dL (ref 8.9–10.3)
Chloride: 105 mmol/L (ref 101–111)
GFR calc Af Amer: >60 mL/min (ref >60)
GFR calc non Af Amer: >60 mL/min (ref >60)
Glucose, Bld: 159 mg/dL — ABNORMAL HIGH (ref 65–99)
Potassium: 3.6 mmol/L (ref 3.5–5.1)
SODIUM: 138 mmol/L (ref 135–145)

## 2015-11-16 LAB — APTT: APTT: 26 s (ref 24–36)

## 2015-11-16 NOTE — Patient Instructions (Signed)
  Your procedure is scheduled [id number] June 20 , 2017. Report to Same Day Surgery. Call Same Day Surgery on Monday November 28, 2015 at [phone] between 1-3 pm.  Remember: Instructions that are not followed completely may result in serious medical risk, up to and including death, or upon the discretion of your surgeon and anesthesiologist your surgery may need to be rescheduled.    _x___ 1. Do not eat food or drink liquids after midnight. No gum chewing or hard candies.     ____ 2. No Alcohol for 24 hours before or after surgery.   ____ 3. Bring all medications with you on the day of surgery if instructed.    __x__ 4. Notify your doctor if there is any change in your medical condition     (cold, fever, infections).     Do not wear jewelry, make-up, hairpins, clips or nail polish.  Do not wear lotions, powders, or perfumes.   Do not shave 48 hours prior to surgery. Men may shave face and neck.  Do not bring valuables to the hospital.    [facility] is not responsible for any belongings or valuables.               Contacts, dentures or bridgework may not be worn into surgery.  Leave your suitcase in the car. After surgery it may be brought to your room.  For patients admitted to the hospital, discharge time is determined by your treatment team.   Patients discharged the day of surgery will not be allowed to drive home.    Please read over the following fact sheets that you were given:   [facility] Preparing for Surgery  ____ Take these medicines the morning of surgery with A SIP OF WATER: NONE     ____ Fleet Enema (as directed)   __x__ Use CHG Soap as directed on instruction sheet  ____ Use inhalers on the day of surgery and bring to hospital day of surgery  _x_ Stop metformin 2 days prior to surgery, stop on 11/27/15.    ____ Take 1/2 of usual insulin dose the night before surgery and none on the morning of  surgery.   ____ Stop Coumadin/Plavix/aspirin on does not  apply.  _x___ Stop Anti-inflammatories such as Advil, Aleve, Ibuprofen, Motrin, Naproxen, Naprosyn, Goodies powders or aspirin products. Tylenol is OK to take.   ____ Stop supplements until after surgery.    ____ Bring C-Pap to the hospital.

## 2015-11-16 NOTE — Pre-Procedure Instructions (Signed)
Dr. Erlene Quan want to continue with Ancef 2 grams IVPB pre-op order in light of pt's allergy to Ceftin GI upset.

## 2015-11-21 ENCOUNTER — Ambulatory Visit: Payer: No Typology Code available for payment source

## 2015-11-28 ENCOUNTER — Other Ambulatory Visit: Payer: Self-pay | Admitting: Radiology

## 2015-11-29 ENCOUNTER — Ambulatory Visit: Payer: BLUE CROSS/BLUE SHIELD | Admitting: Anesthesiology

## 2015-11-29 ENCOUNTER — Ambulatory Visit: Payer: BLUE CROSS/BLUE SHIELD

## 2015-11-29 ENCOUNTER — Ambulatory Visit
Admission: RE | Admit: 2015-11-29 | Discharge: 2015-11-29 | Disposition: A | Discharge status: Home / Self Care | Payer: BLUE CROSS/BLUE SHIELD | Source: Ambulatory Visit | Attending: Urology | Admitting: Urology

## 2015-11-29 ENCOUNTER — Observation Stay
Admission: RE | Admit: 2015-11-29 | Discharge: 2015-11-30 | Disposition: A | Discharge status: Home / Self Care | Payer: BLUE CROSS/BLUE SHIELD | Source: Ambulatory Visit | Attending: Urology | Admitting: Urology

## 2015-11-29 ENCOUNTER — Encounter: Payer: Self-pay | Admitting: *Deleted

## 2015-11-29 ENCOUNTER — Telehealth: Payer: Self-pay | Admitting: Radiology

## 2015-11-29 ENCOUNTER — Encounter
Admission: RE | Disposition: A | Discharge status: Home / Self Care | Payer: Self-pay | Source: Ambulatory Visit | Attending: Urology

## 2015-11-29 DIAGNOSIS — Z87442 Personal history of urinary calculi: Secondary | ICD-10-CM

## 2015-11-29 DIAGNOSIS — N200 Calculus of kidney: Secondary | ICD-10-CM | POA: Diagnosis not present

## 2015-11-29 DIAGNOSIS — M1990 Unspecified osteoarthritis, unspecified site: Secondary | ICD-10-CM | POA: Insufficient documentation

## 2015-11-29 DIAGNOSIS — Z79899 Other long term (current) drug therapy: Secondary | ICD-10-CM

## 2015-11-29 DIAGNOSIS — Z9049 Acquired absence of other specified parts of digestive tract: Secondary | ICD-10-CM | POA: Insufficient documentation

## 2015-11-29 DIAGNOSIS — E785 Hyperlipidemia, unspecified: Secondary | ICD-10-CM | POA: Diagnosis not present

## 2015-11-29 DIAGNOSIS — Z6841 Body Mass Index (BMI) 40.0 and over, adult: Secondary | ICD-10-CM | POA: Diagnosis not present

## 2015-11-29 DIAGNOSIS — Z87891 Personal history of nicotine dependence: Secondary | ICD-10-CM | POA: Insufficient documentation

## 2015-11-29 DIAGNOSIS — Z91030 Bee allergy status: Secondary | ICD-10-CM | POA: Insufficient documentation

## 2015-11-29 DIAGNOSIS — Z881 Allergy status to other antibiotic agents status: Secondary | ICD-10-CM

## 2015-11-29 DIAGNOSIS — Z91048 Other nonmedicinal substance allergy status: Secondary | ICD-10-CM

## 2015-11-29 DIAGNOSIS — C652 Malignant neoplasm of left renal pelvis: Secondary | ICD-10-CM | POA: Insufficient documentation

## 2015-11-29 DIAGNOSIS — R0602 Shortness of breath: Secondary | ICD-10-CM | POA: Insufficient documentation

## 2015-11-29 DIAGNOSIS — Z8042 Family history of malignant neoplasm of prostate: Secondary | ICD-10-CM | POA: Insufficient documentation

## 2015-11-29 DIAGNOSIS — E039 Hypothyroidism, unspecified: Secondary | ICD-10-CM

## 2015-11-29 DIAGNOSIS — N132 Hydronephrosis with renal and ureteral calculous obstruction: Principal | ICD-10-CM | POA: Insufficient documentation

## 2015-11-29 DIAGNOSIS — Z419 Encounter for procedure for purposes other than remedying health state, unspecified: Secondary | ICD-10-CM

## 2015-11-29 DIAGNOSIS — K589 Irritable bowel syndrome without diarrhea: Secondary | ICD-10-CM | POA: Insufficient documentation

## 2015-11-29 DIAGNOSIS — Z888 Allergy status to other drugs, medicaments and biological substances status: Secondary | ICD-10-CM | POA: Insufficient documentation

## 2015-11-29 DIAGNOSIS — E119 Type 2 diabetes mellitus without complications: Secondary | ICD-10-CM

## 2015-11-29 DIAGNOSIS — C642 Malignant neoplasm of left kidney, except renal pelvis: Secondary | ICD-10-CM | POA: Diagnosis not present

## 2015-11-29 DIAGNOSIS — I10 Essential (primary) hypertension: Secondary | ICD-10-CM | POA: Insufficient documentation

## 2015-11-29 DIAGNOSIS — Z7984 Long term (current) use of oral hypoglycemic drugs: Secondary | ICD-10-CM | POA: Insufficient documentation

## 2015-11-29 DIAGNOSIS — F419 Anxiety disorder, unspecified: Secondary | ICD-10-CM | POA: Insufficient documentation

## 2015-11-29 DIAGNOSIS — I499 Cardiac arrhythmia, unspecified: Secondary | ICD-10-CM | POA: Insufficient documentation

## 2015-11-29 DIAGNOSIS — R0600 Dyspnea, unspecified: Secondary | ICD-10-CM

## 2015-11-29 DIAGNOSIS — Z936 Other artificial openings of urinary tract status: Secondary | ICD-10-CM

## 2015-11-29 DIAGNOSIS — Z841 Family history of disorders of kidney and ureter: Secondary | ICD-10-CM

## 2015-11-29 DIAGNOSIS — H409 Unspecified glaucoma: Secondary | ICD-10-CM | POA: Insufficient documentation

## 2015-11-29 HISTORY — PX: NEPHROLITHOTOMY: SHX5134

## 2015-11-29 LAB — BASIC METABOLIC PANEL
Anion gap: 8 (ref 5–15)
BUN: 15 mg/dL (ref 6–20)
CHLORIDE: 107 mmol/L (ref 101–111)
CO2: 24 mmol/L (ref 22–32)
CREATININE: 0.79 mg/dL (ref 0.44–1.00)
Calcium: 8.5 mg/dL — ABNORMAL LOW (ref 8.9–10.3)
GFR calc non Af Amer: >60 mL/min (ref >60)
GLUCOSE: 203 mg/dL — AB (ref 65–99)
Potassium: 3.9 mmol/L (ref 3.5–5.1)
Sodium: 139 mmol/L (ref 135–145)

## 2015-11-29 LAB — CBC
HEMATOCRIT: 40.1 % (ref 35.0–47.0)
HEMOGLOBIN: 13.1 g/dL (ref 12.0–16.0)
MCH: 29.1 pg (ref 26.0–34.0)
MCHC: 32.6 g/dL (ref 32.0–36.0)
MCV: 89.2 fL (ref 80.0–100.0)
Platelets: 257 10*3/uL (ref 150–440)
RBC: 4.49 MIL/uL (ref 3.80–5.20)
RDW: 14.2 % (ref 11.5–14.5)
WBC: 14.5 10*3/uL — ABNORMAL HIGH (ref 3.6–11.0)

## 2015-11-29 LAB — GLUCOSE, CAPILLARY: Glucose-Capillary: 185 mg/dL — ABNORMAL HIGH (ref 65–99)

## 2015-11-29 SURGERY — NEPHROLITHOTOMY PERCUTANEOUS
Anesthesia: General | Laterality: Left | Wound class: Clean

## 2015-11-29 MED ORDER — ONDANSETRON HCL 4 MG/2ML IJ SOLN
4.0000 mg | INTRAMUSCULAR | Status: DC | PRN
  Administered 2015-11-30: 4 mg via INTRAVENOUS
  Filled 2015-11-29: qty 2

## 2015-11-29 MED ORDER — FENTANYL CITRATE (PF) 100 MCG/2ML IJ SOLN
INTRAMUSCULAR | Status: AC
  Filled 2015-11-29: qty 2

## 2015-11-29 MED ORDER — PHENYLEPHRINE HCL 10 MG/ML IJ SOLN
INTRAMUSCULAR | Status: DC | PRN
  Administered 2015-11-29 (×2): 50 ug via INTRAVENOUS

## 2015-11-29 MED ORDER — ACETAMINOPHEN 325 MG PO TABS
650.0000 mg | ORAL_TABLET | ORAL | Status: DC | PRN
Start: 2015-11-29 — End: 2015-11-30

## 2015-11-29 MED ORDER — HEPARIN SODIUM (PORCINE) 5000 UNIT/ML IJ SOLN
5000.0000 [IU] | Freq: Three times a day (TID) | INTRAMUSCULAR | Status: DC
  Administered 2015-11-29 – 2015-11-30 (×2): 5000 [IU] via SUBCUTANEOUS
  Filled 2015-11-29 (×2): qty 1

## 2015-11-29 MED ORDER — SODIUM CHLORIDE 0.9 % IV SOLN
Freq: Once | INTRAVENOUS | Status: DC
Start: 2015-11-29 — End: 2015-11-29

## 2015-11-29 MED ORDER — MORPHINE SULFATE (PF) 2 MG/ML IV SOLN
2.0000 mg | INTRAVENOUS | Status: DC | PRN
  Administered 2015-11-30: 2 mg via INTRAVENOUS
  Administered 2015-11-30: 4 mg via INTRAVENOUS
  Filled 2015-11-29: qty 2
  Filled 2015-11-29: qty 1
  Filled 2015-11-29: qty 2

## 2015-11-29 MED ORDER — DEXAMETHASONE SODIUM PHOSPHATE 10 MG/ML IJ SOLN
INTRAMUSCULAR | Status: DC | PRN
  Administered 2015-11-29: 10 mg via INTRAVENOUS

## 2015-11-29 MED ORDER — HYDROCODONE-ACETAMINOPHEN 5-325 MG PO TABS: 1.0000 | ORAL_TABLET | Freq: Four times a day (QID) | ORAL | Status: DC | PRN

## 2015-11-29 MED ORDER — DIPHENHYDRAMINE HCL 12.5 MG/5ML PO ELIX: 12.5000 mg | ORAL_SOLUTION | Freq: Four times a day (QID) | ORAL | Status: DC | PRN

## 2015-11-29 MED ORDER — DOCUSATE SODIUM 100 MG PO CAPS: 100.0000 mg | ORAL_CAPSULE | Freq: Two times a day (BID) | ORAL | Status: DC

## 2015-11-29 MED ORDER — SODIUM CHLORIDE 0.9 % IV SOLN
INTRAVENOUS | Status: DC
  Administered 2015-11-29 – 2015-11-30 (×3): via INTRAVENOUS

## 2015-11-29 MED ORDER — MIDAZOLAM HCL 2 MG/2ML IJ SOLN
INTRAMUSCULAR | Status: DC | PRN
  Administered 2015-11-29: 2 mg via INTRAVENOUS

## 2015-11-29 MED ORDER — PROPOFOL 10 MG/ML IV BOLUS
INTRAVENOUS | Status: DC | PRN
  Administered 2015-11-29: 150 mg via INTRAVENOUS
  Administered 2015-11-29: 170 mg via INTRAVENOUS

## 2015-11-29 MED ORDER — FENTANYL CITRATE (PF) 100 MCG/2ML IJ SOLN
INTRAMUSCULAR | Status: DC | PRN
  Administered 2015-11-29: 100 ug via INTRAVENOUS

## 2015-11-29 MED ORDER — ACETAMINOPHEN 10 MG/ML IV SOLN
INTRAVENOUS | Status: DC | PRN
  Administered 2015-11-29: 1000 mg via INTRAVENOUS

## 2015-11-29 MED ORDER — OXYBUTYNIN CHLORIDE 5 MG PO TABS: 5.0000 mg | ORAL_TABLET | Freq: Three times a day (TID) | ORAL | Status: DC | PRN

## 2015-11-29 MED ORDER — MIDAZOLAM HCL 5 MG/5ML IJ SOLN
INTRAMUSCULAR | Status: AC
  Filled 2015-11-29: qty 5

## 2015-11-29 MED ORDER — FENTANYL CITRATE (PF) 100 MCG/2ML IJ SOLN: 50.0000 ug | Freq: Once | INTRAMUSCULAR | Status: DC

## 2015-11-29 MED ORDER — METFORMIN HCL ER 500 MG PO TB24
750.0000 mg | ORAL_TABLET | Freq: Two times a day (BID) | ORAL | Status: DC
  Administered 2015-11-29 – 2015-11-30 (×2): 750 mg via ORAL
  Filled 2015-11-29 (×2): qty 2

## 2015-11-29 MED ORDER — ROCURONIUM BROMIDE 100 MG/10ML IV SOLN
INTRAVENOUS | Status: DC | PRN
  Administered 2015-11-29: 35 mg via INTRAVENOUS
  Administered 2015-11-29: 5 mg via INTRAVENOUS

## 2015-11-29 MED ORDER — OXYCODONE-ACETAMINOPHEN 5-325 MG PO TABS
1.0000 | ORAL_TABLET | ORAL | Status: DC | PRN
  Administered 2015-11-29 – 2015-11-30 (×4): 2 via ORAL
  Filled 2015-11-29 (×5): qty 2

## 2015-11-29 MED ORDER — METOPROLOL SUCCINATE ER 50 MG PO TB24
50.0000 mg | ORAL_TABLET | Freq: Every day | ORAL | Status: DC
  Administered 2015-11-29: 50 mg via ORAL
  Filled 2015-11-29: qty 1

## 2015-11-29 MED ORDER — LIDOCAINE HCL (CARDIAC) 20 MG/ML IV SOLN
INTRAVENOUS | Status: DC | PRN
  Administered 2015-11-29: 60 mg via INTRAVENOUS

## 2015-11-29 MED ORDER — DIPHENHYDRAMINE HCL 50 MG/ML IJ SOLN: 12.5000 mg | Freq: Four times a day (QID) | INTRAMUSCULAR | Status: DC | PRN

## 2015-11-29 MED ORDER — CIPROFLOXACIN IN D5W 400 MG/200ML IV SOLN: 400.0000 mg | INTRAVENOUS | Status: DC

## 2015-11-29 MED ORDER — LIDOCAINE HCL (PF) 1 % IJ SOLN
INTRAMUSCULAR | Status: AC
  Filled 2015-11-29: qty 30

## 2015-11-29 MED ORDER — CLINDAMYCIN PHOSPHATE 300 MG/50ML IV SOLN
INTRAVENOUS | Status: AC
  Filled 2015-11-29: qty 50

## 2015-11-29 MED ORDER — OXYBUTYNIN CHLORIDE 5 MG PO TABS
5.0000 mg | ORAL_TABLET | Freq: Three times a day (TID) | ORAL | Status: DC | PRN
  Administered 2015-11-30: 5 mg via ORAL
  Filled 2015-11-29: qty 1

## 2015-11-29 MED ORDER — FENTANYL CITRATE (PF) 100 MCG/2ML IJ SOLN
25.0000 ug | INTRAMUSCULAR | Status: DC | PRN
  Administered 2015-11-29 (×4): 25 ug via INTRAVENOUS

## 2015-11-29 MED ORDER — ONDANSETRON HCL 4 MG/2ML IJ SOLN
4.0000 mg | Freq: Once | INTRAMUSCULAR | Status: AC | PRN
  Administered 2015-11-29: 4 mg via INTRAVENOUS

## 2015-11-29 MED ORDER — FENTANYL CITRATE (PF) 100 MCG/2ML IJ SOLN
INTRAMUSCULAR | Status: AC
  Administered 2015-11-29: 50 ug
  Filled 2015-11-29: qty 2

## 2015-11-29 MED ORDER — FAMOTIDINE 20 MG PO TABS
20.0000 mg | ORAL_TABLET | Freq: Once | ORAL | Status: AC
  Administered 2015-11-29: 20 mg via ORAL

## 2015-11-29 MED ORDER — ONDANSETRON HCL 4 MG/2ML IJ SOLN
INTRAMUSCULAR | Status: AC
  Filled 2015-11-29: qty 2

## 2015-11-29 MED ORDER — IOPAMIDOL (ISOVUE-300) INJECTION 61%
30.0000 mL | Freq: Once | INTRAVENOUS | Status: AC | PRN
Start: 2015-11-29 — End: 2015-11-29
  Administered 2015-11-29: 20 mL

## 2015-11-29 MED ORDER — CEFAZOLIN SODIUM-DEXTROSE 2-3 GM-% IV SOLR
INTRAVENOUS | Status: DC | PRN
  Administered 2015-11-29: 2 g via INTRAVENOUS

## 2015-11-29 MED ORDER — SODIUM CHLORIDE 0.9 % IV SOLN
INTRAVENOUS | Status: DC
  Administered 2015-11-29 (×2): via INTRAVENOUS

## 2015-11-29 MED ORDER — FENTANYL CITRATE (PF) 100 MCG/2ML IJ SOLN
INTRAMUSCULAR | Status: AC | PRN
  Administered 2015-11-29 (×4): 50 ug via INTRAVENOUS

## 2015-11-29 MED ORDER — CEFAZOLIN SODIUM-DEXTROSE 2-4 GM/100ML-% IV SOLN: 2.0000 g | Freq: Once | INTRAVENOUS | Status: DC

## 2015-11-29 MED ORDER — SUCCINYLCHOLINE CHLORIDE 20 MG/ML IJ SOLN
INTRAMUSCULAR | Status: DC | PRN
  Administered 2015-11-29 (×2): 100 mg via INTRAVENOUS

## 2015-11-29 MED ORDER — MIDAZOLAM HCL 2 MG/2ML IJ SOLN
INTRAMUSCULAR | Status: AC | PRN
  Administered 2015-11-29 (×5): 1 mg via INTRAVENOUS

## 2015-11-29 MED ORDER — IOTHALAMATE MEGLUMINE 43 % IV SOLN
INTRAVENOUS | Status: DC | PRN
  Administered 2015-11-29: 30 mL

## 2015-11-29 MED ORDER — ONDANSETRON HCL 4 MG/2ML IJ SOLN
INTRAMUSCULAR | Status: DC | PRN
  Administered 2015-11-29: 4 mg via INTRAVENOUS

## 2015-11-29 MED ORDER — LORATADINE 10 MG PO TABS: 10.0000 mg | ORAL_TABLET | Freq: Every day | ORAL | Status: DC | PRN

## 2015-11-29 MED ORDER — DOCUSATE SODIUM 100 MG PO CAPS
100.0000 mg | ORAL_CAPSULE | Freq: Two times a day (BID) | ORAL | Status: DC
  Filled 2015-11-29 (×2): qty 1

## 2015-11-29 MED ORDER — BELLADONNA ALKALOIDS-OPIUM 16.2-60 MG RE SUPP: 1.0000 | Freq: Four times a day (QID) | RECTAL | Status: DC | PRN

## 2015-11-29 MED ORDER — SUGAMMADEX SODIUM 500 MG/5ML IV SOLN
INTRAVENOUS | Status: DC | PRN
  Administered 2015-11-29: 230 mg via INTRAVENOUS

## 2015-11-29 MED ORDER — CLINDAMYCIN PHOSPHATE 300 MG/50ML IV SOLN
300.0000 mg | Freq: Once | INTRAVENOUS | Status: AC
  Administered 2015-11-29: 300 mg via INTRAVENOUS

## 2015-11-29 MED ORDER — TAMSULOSIN HCL 0.4 MG PO CAPS: 0.4000 mg | ORAL_CAPSULE | Freq: Every day | ORAL | Status: DC

## 2015-11-29 MED ORDER — ACETAMINOPHEN 10 MG/ML IV SOLN
INTRAVENOUS | Status: AC
  Filled 2015-11-29: qty 100

## 2015-11-29 SURGICAL SUPPLY — 65 items
ADAPTER IRRIG TUBE 2 SPIKE SOL (ADAPTER) ×6 IMPLANT
ADAPTER SCOPE UROLOK II (MISCELLANEOUS) ×3 IMPLANT
BAG URO DRAIN 2000ML W/SPOUT (MISCELLANEOUS) ×3 IMPLANT
BALLN NEPHROMAX 24X8X12 (CATHETERS)
BALLN NEPHROMAX 30X10X12 (MISCELLANEOUS) ×3
BALLOON NEPHROMAX 24X8X12 (CATHETERS) IMPLANT
BALLOON NEPHROMAX 30X10X12 (MISCELLANEOUS) ×1 IMPLANT
BLADE SURG 15 STRL LF DISP TIS (BLADE) ×1 IMPLANT
BLADE SURG 15 STRL SS (BLADE) ×2
CATH BRONCH ASP 14F (CATHETERS) IMPLANT
CATH COUNCIL 22FR (CATHETERS) IMPLANT
CATH FOLEY 2W COUNCIL 5CC 18FR (CATHETERS) ×3 IMPLANT
CATH STENT KAYE NEPHR TAMP (CATHETERS) ×1 IMPLANT
CATH TRAY 16F METER LATEX (MISCELLANEOUS) ×3 IMPLANT
CATH URETL 5X70 OPEN END (CATHETERS) ×3 IMPLANT
CATH/STENT KAYE NEPHR TAMP (CATHETERS) ×3
CHLORAPREP W/TINT 26ML (MISCELLANEOUS) ×3 IMPLANT
CNTNR SPEC 2.5X3XGRAD LEK (MISCELLANEOUS) ×1
CONRAY 43 FOR UROLOGY 50M (MISCELLANEOUS) ×12 IMPLANT
CONT SPEC 4OZ STER OR WHT (MISCELLANEOUS) ×2
CONTAINER SPEC 2.5X3XGRAD LEK (MISCELLANEOUS) ×1 IMPLANT
DEVICE INFLATION 26 ENCORE (MISCELLANEOUS) IMPLANT
DRAPE C-ARM XRAY 36X54 (DRAPES) ×3 IMPLANT
DRAPE SHEET LG 3/4 BI-LAMINATE (DRAPES) ×3 IMPLANT
DRAPE SURG 17X11 SM STRL (DRAPES) ×12 IMPLANT
DRESSING TELFA 4X3 1S ST N-ADH (GAUZE/BANDAGES/DRESSINGS) ×3 IMPLANT
GAUZE SPONGE 4X4 12PLY STRL (GAUZE/BANDAGES/DRESSINGS) ×3 IMPLANT
GLIDEWIRE STIFF .35X180X3 HYDR (WIRE) ×3 IMPLANT
GLOVE BIO SURGEON STRL SZ 6.5 (GLOVE) ×4 IMPLANT
GLOVE BIO SURGEON STRL SZ7 (GLOVE) ×6 IMPLANT
GLOVE BIO SURGEONS STRL SZ 6.5 (GLOVE) ×2
GLOVE BIOGEL PI IND STRL 6.5 (GLOVE) ×2 IMPLANT
GLOVE BIOGEL PI INDICATOR 6.5 (GLOVE) ×4
GOWN STRL REUS W/ TWL LRG LVL3 (GOWN DISPOSABLE) ×2 IMPLANT
GOWN STRL REUS W/TWL LRG LVL3 (GOWN DISPOSABLE) ×4
GUIDEWIRE GREEN .038 145CM (MISCELLANEOUS) ×3 IMPLANT
GUIDEWIRE INTRO SET STRAIGHT (WIRE) ×3 IMPLANT
GUIDEWIRE STR ZIPWIRE 035X150 (MISCELLANEOUS) ×3 IMPLANT
GUIDEWIRE SUPER STIFF .035X180 (WIRE) ×3 IMPLANT
HOLDER FOLEY CATH W/STRAP (MISCELLANEOUS) ×3 IMPLANT
INTRODUCER DILATOR DOUBLE (INTRODUCER) ×3 IMPLANT
MANIFOLD NEPTUNE II (INSTRUMENTS) ×3 IMPLANT
NDL FASCIA INCISION 18GA (NEEDLE) ×3 IMPLANT
PACK BASIN MINOR ARMC (MISCELLANEOUS) ×3 IMPLANT
PAD ABD DERMACEA PRESS 5X9 (GAUZE/BANDAGES/DRESSINGS) ×3 IMPLANT
PROBE CYBERWAND SET (MISCELLANEOUS) ×3 IMPLANT
SENSORWIRE 0.038 NOT ANGLED (WIRE) ×6
SET IRRIG Y TYPE TUR BLADDER L (SET/KITS/TRAYS/PACK) ×3 IMPLANT
SET IRRIGATING DISP (SET/KITS/TRAYS/PACK) ×3 IMPLANT
SHEET NEURO XL SOL CTL (MISCELLANEOUS) ×3 IMPLANT
SOL .9 NS 3000ML IRR  AL (IV SOLUTION) ×8
SOL .9 NS 3000ML IRR UROMATIC (IV SOLUTION) ×4 IMPLANT
SPONGE DRAIN TRACH 4X4 STRL 2S (GAUZE/BANDAGES/DRESSINGS) ×3 IMPLANT
STENT URET 6FRX24 CONTOUR (STENTS) ×3 IMPLANT
SUT SILK 0 SH 30 (SUTURE) ×3 IMPLANT
SYR 20CC LL (SYRINGE) ×3 IMPLANT
SYR 30ML LL (SYRINGE) ×3 IMPLANT
SYRINGE 10CC LL (SYRINGE) ×3 IMPLANT
SYRINGE IRR TOOMEY STRL 70CC (SYRINGE) ×3 IMPLANT
TAPE MICROFOAM 4IN (TAPE) ×3 IMPLANT
TRAP SPECIMEN MUCOUS 40CC (MISCELLANEOUS) IMPLANT
TUBING CONNECTING 10 (TUBING) ×2 IMPLANT
TUBING CONNECTING 10' (TUBING) ×1
WATER STERILE IRR 1000ML POUR (IV SOLUTION) ×3 IMPLANT
WIRE SENSOR 0.038 NOT ANGLED (WIRE) ×2 IMPLANT

## 2015-11-29 NOTE — Progress Notes (Signed)
Awake. C/O need to have BM. Wants to get OOB. Oriented to place per nurse. Instructed could not get OOB. Placed on bedpan.

## 2015-11-29 NOTE — Progress Notes (Signed)
Pt to specials for procedure

## 2015-11-29 NOTE — Telephone Encounter (Signed)
Appt made for f/u pathology review, possible cysto/ stent removal on 12/12/15 @3 :00. Will make pt aware.

## 2015-11-29 NOTE — Transfer of Care (Signed)
Immediate Anesthesia Transfer of Care Note  Patient: Diamond Sosa  Procedure(s) Performed: Procedure(s): NEPHROLITHOTOMY PERCUTANEOUS WITH URETERAL STENT PLACEMENT /BIOPSIES (Left)  Patient Location: PACU  Anesthesia Type:General  Level of Consciousness: awake and patient cooperative  Airway & Oxygen Therapy: Patient Spontanous Breathing and Patient connected to face mask oxygen  Post-op Assessment: Report given to RN  Post vital signs: Reviewed and stable  Last Vitals:  Filed Vitals:   11/29/15 1242 11/29/15 1245  BP: 149/103 167/82  Pulse: 84 84  Temp:  98.66F  Resp: 14 21    Last Pain: There were no vitals filed for this visit.       Complications: No apparent anesthesia complications

## 2015-11-29 NOTE — Op Note (Signed)
Date of procedure: 11/29/2015  Preoperative diagnosis:  1. Left nephrolithiasis   Postoperative diagnosis:  1. Left nephrolithiasis 2. Renal pelvic lesion (left)   Procedure: 1. Left percutaneous nephrolithotomy 2. Antegrade nephrostogram 3. Left antegrade ureteral stent placement 4. Left renal pelvic biopsy 5. Interpretation of fluoroscopy less than 30 minutes 6. Left nephrostomy tube placement  Surgeon: Hollice Espy, MD  Anesthesia: General  Complications: None  Intraoperative findings: 1.9 cm and 1.2 cm stones treated.  Bullous edema versus papillary tumor identified within the renal pelvis, biopsied.  EBL: 200 cc   Specimens: Stone fragment, left upper tract collecting system biopsy for frozen and permanent  Drains: 6 x 26 4 French double-J ureteral stent on left, 18 [place] council tip nephrostomy tube, 5 [place] open-ended nephroureteral catheter  Indication: Diamond Sosa is a 60 y.o. patient with gross hematuria and 2 large left stones measuring 1.2 cm and 1.9 cm..  After reviewing the management options for treatment, she elected to proceed with the above surgical procedure(s). We have discussed the potential benefits and risks of the procedure, side effects of the proposed treatment, the likelihood of the patient achieving the goals of the procedure, and any potential problems that might occur during the procedure or recuperation. Informed consent has been obtained.  Description of procedure:  On the morning of the procedure, she underwent left nephroureteral access in interventional radiology at which time a nephroureteral catheter was placed in a posterior lower pole calyx.  She tolerated this procedure well and was brought to the preop area.  The patient was taken to the operating room and general anesthesia was induced.  A Foley catheter was placed under sterile conditions.  The patient was placed in the prone position, prepped and draped in the usual sterile  fashion, and preoperative antibiotics were administered.  Care was taken to pad all pressure points. She was strapped to the table using gel pads and tape. A preoperative time-out was performed.   This point in time several scout images were obtained of the stones within the left kidney and positioning of the nephroureteral catheter which went all the way down into the bladder. An antegrade nephrostogram was performed through this catheter confirming the presence within the bladder. The nephroureteral catheter was then cannulated using a sensor wire down to the level of the bladder and the catheter was removed leaving the wire place. A safety wire introducer was then used to to introduce the Super Stiff wire down to level of the bladder as well. The sensor wire was used as a safety wire and the superstiff as a working wire. A fascial incisor needle was then used to incise the fascia. A NephroMax balloon was then used to dilate the tract up to 18 cm of water.  This was left in place for several minutes for hemostasis. The nephrostomy access sheath was then advanced over the balloon and once adequate positioning was achieved, the balloon was deflated and removed. The nephroscope was introduced and the stone was immediately apparent. A cyber wand was used to fragment and suction all the stone material. The stone was crystal-like and in multiple cohesive fragments or other than one solid stone. Within the renal pelvis with stone a been previously large, the mucosa of the urothelium appeared grossly abnormal. It was unclear whether this represented severe bullous edema versus papillary tumor. Biopsy forceps are used to send off a frozen section which was consistent with edematous urothelial tissue with atypia and malignancy was able to be  ruled out. Several additional biopsies were taken and sent for permanent specimen.  A flexible cystoscope was then brought in and formal pyeloscopy was performed to ensure that there  was no residual stone fragment. The upper and mid poles were directly visualized and noted to be some free of stone. At this point in time, a 6 x 24 French double-J ureteral stent was advanced over the Super Stiff wire down to the level of the bladder. The wire was partially drawn until full coil was clearly within the bladder crossing the midline. The wire was then completely withdrawn and a coil was positioned appropriately within the renal pelvis. Next, a 62 [place] council tip catheter was advanced with the tip within the renal pelvis and the balloon was filled with 1.5 cc of diluted contrast. A second antegrade nephrostogram was then performed through the nephrostomy tube which showed no extravasation and contrast flowed freely down the ureter. There were no filling defects noted. Finally, the sheath was removed leaving the nephrostomy tube and stent in place. A 5 French open-ended ureteral catheter was advanced over the sensor wire to level of the mid ureter and then withdrawn. The nephrostomy tube and the nephroureteral stent were secured to the patient's back using silk suture 2. The patient was then cleaned and dried and a dressing of 4 x 4's, an AVD pad, and foam tape were applied. The patient was then repositioned the supine position on the adjacent structure. She is able to be extubated and taken to the PACU in stable condition. Next  Plan: Patient will remain overnight for observation. Will remove her nephrostomy tube and nephroureteral catheter in the morning. She will return in 2 weeks to review her pathology results as well as possibly remove her stent pending the results. If this indeed is a tumor, we may reevaluate with consideration of a second look ureteroscopy versus other intervention.    Hollice Espy, M.D.

## 2015-11-29 NOTE — CV Procedure (Signed)
L PCN No comp/EBL

## 2015-11-29 NOTE — H&P (Signed)
11/29/15- updated on day of surgery  [facility] RENT [birth year] [id number]  Referring provider: Juluis Pitch, MD [address] [place], [place] [address]  Chief Complaint  Patient presents with  . Follow-up    renal stone, pt needs to be scheduled for PCNL    HPI: 60 year old female with bilateral nephrolithiasis. She initially presented after being seen in the emergency room with a 3 mm right UVJ stone on 06/17/2015. Follow-up renal ultrasound in the interim shows significant improvement in her hydronephrosis on the right and no residual pain and has presumably passed the stone. She did not see the stone passed but was not consistently straining her urine.  On CT abdomen pelvis obtained in the emergency room, she was noted to have 2 very large left renal stones measuring 1.9 cm and 1.2 cm without evidence of obstruction and returns to the office today discuss management of these.  She denies any recent gross hematuria, dysuria, flank pain. No recent fevers or chills.   No previous history of renal stones prior to the above episode.  Reports that she experienced difficulty waking up from general anesthesia in 2002 and is very anxious about being place under again.   She returns to the office today after completing her GI work up Now feeling ready for her kidney stone surgery.    PMH: Past Medical History  Diagnosis Date  . Diabetes mellitus without complication ([place])   . Hypertension   . Thyroid disease     hypothyroid  . 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 IBS (irritable bowel syndrome)   . Microadenoma ([place])   . Cataracts, bilateral   . Adrenal tumor   . Kidney stone   . Inflammatory bowel disease   . Complication of anesthesia   . Shortness of breath dyspnea   . Dysrhythmia   . Hypothyroidism   . Anxiety   . Arthritis     Surgical History: Past Surgical History  Procedure Laterality Date   . Cholecystectomy    . Tonsillectomy    . Cervical cerclage    . Colonoscopy with propofol N/A 09/13/2015    Procedure: COLONOSCOPY WITH PROPOFOL; Surgeon: Lollie Sails, MD; Location: [facility] ENDOSCOPY; Service: Endoscopy; Laterality: N/A;    Home Medications:    Medication List       This list is accurate as of: 10/12/15 1:22 PM. Always use your most recent med list.              cholecalciferol 1000 units tablet  Commonly known as: VITAMIN D  Take 1,000 Units by mouth daily.     colestipol 1 g tablet  Commonly known as: COLESTID  Take by mouth. Reported on 10/12/2015     loratadine 10 MG tablet  Commonly known as: CLARITIN  Take 10 mg by mouth daily as needed for allergies.     metFORMIN 750 MG 24 hr tablet  Commonly known as: GLUCOPHAGE-XR  Take 750 mg by mouth 2 (two) times daily.     metoprolol succinate 50 MG 24 hr tablet  Commonly known as: TOPROL-XL  Take 50 mg by mouth daily. Take with or immediately following a meal.        Allergies:  Allergies  Allergen Reactions  . Bee Venom Shortness Of Breath  . Cefuroxime Axetil     REACTION: GI distress and tongue breaks out  . Erythromycin Base     Family History: Family History  Problem Relation Age of Onset  . Prostate  cancer Father   . Urolithiasis Mother   . Kidney disease Neg Hx   . Kidney cancer Neg Hx     Social History:  reports that she quit smoking about 15 years ago. She does not have any smokeless tobacco history on file. She reports that she does not drink alcohol or use illicit drugs.  ROS: UROLOGY Frequent Urination?: No Hard to postpone urination?: Yes Burning/pain with urination?: No Get up at night to urinate?: No Leakage of urine?: Yes Urine stream starts and stops?: No Trouble starting stream?: No Do you have to strain to urinate?: No Blood in urine?: No Urinary tract  infection?: No Sexually transmitted disease?: No Injury to kidneys or bladder?: No Painful intercourse?: No Weak stream?: No Currently pregnant?: No Vaginal bleeding?: No Last menstrual period?: n  Gastrointestinal Nausea?: No Vomiting?: No Indigestion/heartburn?: Yes Diarrhea?: Yes Constipation?: No  Constitutional Fever: No Night sweats?: No Weight loss?: No Fatigue?: Yes  Skin Skin rash/lesions?: No Itching?: No  Eyes Blurred vision?: Yes Double vision?: Yes  Ears/Nose/Throat Sore throat?: Yes Sinus problems?: Yes  Hematologic/Lymphatic Swollen glands?: No Easy bruising?: No  Cardiovascular Leg swelling?: Yes Chest pain?: Yes  Respiratory Cough?: No Shortness of breath?: Yes  Endocrine Excessive thirst?: No  Musculoskeletal Back pain?: Yes Joint pain?: Yes  Neurological Headaches?: No Dizziness?: No  Psychologic Depression?: No Anxiety?: Yes  Physical Exam: BP 129/78 mmHg  Pulse 80  Ht 5\' 3"  (1.6 m)  Wt 247 lb 11.2 oz (112.356 kg)  BMI 43.89 kg/m2  Constitutional: Alert and oriented, No acute distress. HEENT: [place] AT, moist mucus membranes. Trachea midline, no masses. Cardiovascular: No clubbing, cyanosis, or edema. RRR. Respiratory: Normal respiratory effort, no increased work of breathing. CTAB. GI: Abdomen is soft, nontender, nondistended, no abdominal masses. Obese. GU: No CVA tenderness.  Skin: No rashes, bruises or suspicious lesions. Neurologic: Grossly intact, no focal deficits, moving all 4 extremities. Psychiatric: Normal mood and affect.  Laboratory Data:   Urinalysis Results for orders placed or performed in visit on 10/12/15  Microscopic Examination  Result Value Ref Range   WBC, UA 11-30 (A) 0 - 5 /hpf   RBC, UA >30 (A) 0 - 2 /hpf   Epithelial Cells (non renal) 0-10 0 - 10 /hpf   Mucus, UA Present (A) Not Estab.   Bacteria, UA None seen None seen/Few  Urinalysis, Complete   Result Value Ref Range   Specific Gravity, UA 1.020 1.005 - 1.030   pH, UA 5.0 5.0 - 7.5   Color, UA Yellow Yellow   Appearance Ur Hazy (A) Clear   Leukocytes, UA 1+ (A) Negative   Protein, UA Trace (A) Negative/Trace   Glucose, UA Negative Negative   Ketones, UA Negative Negative   RBC, UA 3+ (A) Negative   Bilirubin, UA Negative Negative   Urobilinogen, Ur 0.2 0.2 - 1.0 mg/dL   Nitrite, UA Negative Negative   Microscopic Examination See below:      Pertinent Imaging: CT Stone 06/17/15 CLINICAL DATA: Patient with right lower abdominal pain. Acute onset of symptoms.  EXAM: CT ABDOMEN AND PELVIS WITHOUT CONTRAST  TECHNIQUE: Multidetector CT imaging of the abdomen and pelvis was performed following the standard protocol without IV contrast.  COMPARISON: None.  FINDINGS: Lower chest: Normal heart size. Dependent atelectasis within the left lung base. No pleural effusion.  Hepatobiliary: Liver is diffusely low in attenuation compatible with hepatic steatosis. Patient status post cholecystectomy.  Pancreas: Unremarkable  Spleen: Unremarkable  Adrenals/Urinary Tract: There is a  3.6 cm low-attenuation mass within the left adrenal gland most compatible with benign adenoma. Right adrenal gland is unremarkable. There is a 1.9 cm nonobstructing stone within the inferior pole of the left kidney. There is an adjacent 1.2 cm nonobstructing stone within the inferior pole of the left kidney. There is moderate right hydroureteronephrosis to the level of the distal right ureter were there is an obstructing 3 mm stone at the right UVJ (image 111; series 5). There is an additional punctate 1- 2 mm stone within the inferior pole of the right kidney.  Stomach/Bowel: Sigmoid colonic diverticulosis. No CT evidence for acute diverticulitis. Marked stranding involving the upper abdominalu mesentery. Stomach is normal  morphology. No free fluid or free intraperitoneal air. The appendix is normal.  Vascular/Lymphatic: Normal caliber abdominal aorta. Peripheral calcified atherosclerotic plaque. No retroperitoneal lymphadenopathy.  Other: The uterus and adnexal structures are unremarkable.  Musculoskeletal: No aggressive or acute appearing osseous lesions.  IMPRESSION: There is an obstructing 3 mm stone within the distal right ureter at the UVJ resulting in moderate right hydroureteronephrosis.  Additional nonobstructing stones within the left kidney as described above.  Fat stranding of the upper abdominal mesentery which is nonspecific and may be seen with mesenteric panniculitis. While not favored, lymphoma is an additional consideration. Consider clinical, laboratory and follow-up imaging evaluation.  Hepatic steatosis.  Electronically Signed  By: Lovey Newcomer M.D.  On: 06/17/2015 19:31   RUS 07/08/15 CLINICAL DATA: Known distal right ureteral stone and left renal calculi  EXAM: RENAL / URINARY TRACT ULTRASOUND COMPLETE  COMPARISON: 06/17/2015  FINDINGS: Right Kidney:  Length: 14 cm. The degree of hydronephrosis when compared with the prior CT has improved significantly. A minimal amount of dilatation is seen.  Left Kidney:  Length: 14.3 cm. Very mild hydronephrosis is noted. Scattered echogenic foci are noted consistent with renal calculi. These are similar to that seen on the prior exam.  Bladder:  Appears normal for degree of bladder distention.  IMPRESSION: Left renal calculi with minimal hydronephrotic change.  Improvement in the degree of right-sided hydronephrosis.   Electronically Signed  By: Inez Catalina M.D.  On: 07/08/2015 14:36  KUB 07/08/15 IMPRESSION: Stable left mid to lower pole kidney stone. A definite ureteral stone is not observed. Correlation with the presence of any persistent hematuria would be useful.   All of  the above imaging is personally reviewed today with the patient.  Assessment & Plan:   1. Nephrolithiasis Based on imaging, she has presumably passed or 3 mm distal right stone. Today, we discussed options for management of her large left nonobstructing stone burden. All imaging reviewed.   SSD 10 cm, 530 HFU.  We discussed various treatment options including ESWL vs. ureteroscopy, laser lithotripsy, and stent (staged procedure) as well as PCNL. We discussed the risks and benefits of each ncluding bleeding, infection, damage to surrounding structures, efficacy with need for possible further intervention, and need for temporary ureteral stent/ nephrostomy tube.   Given the stone burden, I do feel that she would be best served by left PCNL. Specifically, we discussed need for overnight observation, slightly higher risk of bleeding that higher stone free rate with a single procedure.  She would like to proceed with left PCNL as previously discussed. She is now prepared to proceed. She is interested in seeing her cardiologist before the procedure to minimize her risks. She is very anxious about anesthesia.    2. Adrenal adenoma, left Incidental 3.1 cm left adrenal adenoma noted on CT stone  protocol. Patient reports workup by endocrinologist including 24-hour urine presumably as workup for this in the past.   3. Gross hematuria Likely related to urinary tract infection per patient report versus stones. No recurrence since last year. We will address stones and repeat UA after she is stone free to assess for residual microscopic hematuria or recurrent gross hematuria. We'll defer bilateral delayed ureteral imaging/cystoscopy until the aforementioned issues are addressed as this is the most likely cause.   Will schedule L PCNL.  Hollice Espy, MD  [facility] [address] [place], [place] [address] [phone]

## 2015-11-29 NOTE — Telephone Encounter (Signed)
-----   Message from Hollice Espy, MD sent at 11/29/2015 12:40 PM EDT ----- Regarding: f/u pathology review, possible cysto/ stent removal f/u pathology review, possible cysto/ stent removal in 2 weeks (with me).  If needed, open clinic up late Monday 7/3 AM to accommodate  Hollice Espy, MD

## 2015-11-29 NOTE — H&P (Signed)
Chief Complaint: Patient was seen in consultation today for a left nephrostomy at the request of [place]  Referring Physician(s): Hollice Espy  Supervising Physician: Marybelle Killings  Patient Status: outpatient  History of Present Illness: Diamond Sosa is a 60 y.o. female with left nephrolithiasis for PCNL today. She has two left renal stones. The smaller is 11 mm in the lower pole. There is a larger pelvic calculus. She passed a 3 mm right ureteral calculus in January. Left nephrostomy is requested.  Past Medical History  Diagnosis Date  . Diabetes mellitus without complication ([place])   . Hypertension   . Thyroid disease     hypothyroid  . 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 IBS (irritable bowel syndrome)   . Microadenoma ([place])   . Cataracts, bilateral   . Adrenal tumor   . Kidney stone   . Inflammatory bowel disease   . Shortness of breath dyspnea   . Dysrhythmia   . Anxiety   . Complication of anesthesia 2002    during galbladder surgery, pt was extubated in OR and did not breath, needed to be reintubated, pt remembers this and is concerned this will happen again.  [name] Hypothyroidism     boarderline  . Arthritis     generalized    Past Surgical History  Procedure Laterality Date  . Cholecystectomy    . Tonsillectomy    . Cervical cerclage    . Colonoscopy with propofol N/A 09/13/2015    Procedure: COLONOSCOPY WITH PROPOFOL;  Surgeon: Lollie Sails, MD;  Location: [facility] ENDOSCOPY;  Service: Endoscopy;  Laterality: N/A;  . Wisdom tooth extraction Bilateral     all 4    Allergies: Bee venom; Adhesive; Cefuroxime axetil; and Erythromycin base  Medications: Prior to Admission medications   Medication Sig Start Date End Date Taking? Authorizing Provider  cholecalciferol (VITAMIN D) 1000 UNITS tablet Take 2,000 Units by mouth daily after supper.     Historical Provider, MD  loratadine (CLARITIN) 10 MG tablet Take 10 mg by mouth daily as needed for allergies.     Historical Provider, MD  metFORMIN (GLUCOPHAGE-XR) 750 MG 24 hr tablet Take 750 mg by mouth 2 (two) times daily.    Historical Provider, MD  metoprolol succinate (TOPROL-XL) 50 MG 24 hr tablet Take 50 mg by mouth daily. Take with or immediately following a meal at 8pm.    Historical Provider, MD     Family History  Problem Relation Age of Onset  . Prostate cancer Father   . Urolithiasis Mother   . Kidney disease Neg Hx   . Kidney cancer Neg Hx     Social History   Social History  . Marital Status: Married    Spouse Name: N/A  . Number of Children: N/A  . Years of Education: N/A   Social History Main Topics  . Smoking status: Former Smoker    Quit date: 07/31/2000  . Smokeless tobacco: Not on file  . Alcohol Use: No  . Drug Use: No  . Sexual Activity: Yes    Birth Control/ Protection: Post-menopausal   Other Topics Concern  . Not on file   Social History Narrative      Review of Systems: A 12 point ROS discussed and pertinent positives are indicated in the HPI above.  All other systems are negative.  Review of Systems  Vital Signs: There were no vitals taken for this visit.  Physical Exam  Constitutional: She is oriented to person, place,  and time. She appears well-developed and well-nourished.  Cardiovascular: Normal rate and regular rhythm.   Pulmonary/Chest: Effort normal and breath sounds normal.  Abdominal: Soft.  Neurological: She is alert and oriented to person, place, and time.  Skin: Skin is warm and dry.    Mallampati Score: 3    Imaging: No results found.  Labs:  CBC:  Recent Labs  01/25/15 0112 06/17/15 1640 11/16/15 0929  WBC 10.4 14.7* 10.4  HGB 13.6 13.6 13.3  HCT 41.1 41.4 39.0  PLT 319 335 324    COAGS:  Recent Labs  11/16/15 0929  INR 1.00  APTT 26    BMP:  Recent Labs  01/25/15 0112 06/17/15 1640 11/16/15 0929  NA 141 139 138  K 3.8 4.1 3.6  CL 104 102 105  CO2 26 27 24   GLUCOSE 160* 277* 159*  BUN 17  20 11   CALCIUM 9.2 9.5 8.9  CREATININE 0.76 1.03* 0.65  GFRNONAA >60 59* >60  GFRAA >60 >60 >60    LIVER FUNCTION TESTS:  Recent Labs  01/25/15 0112  BILITOT 0.2*  AST 27  ALT 31  ALKPHOS 80  PROT 7.4  ALBUMIN 3.9     Assessment and Plan:  Left nephrolithiasis. Left PCN to follow.  Thank you for this interesting consult.  I greatly enjoyed meeting Diamond Sosa and look forward to participating in their care.  A copy of this report was sent to the requesting provider on this date.  Electronically Signed: Kaelani Kendrick, ART A 11/29/2015, 7:50 AM   I spent a total of  30 Minutes   in face to face in clinical consultation, greater than 50% of which was counseling/coordinating care for left nephrostomy.

## 2015-11-29 NOTE — Anesthesia Procedure Notes (Signed)
Procedure Name: Intubation Date/Time: 11/29/2015 10:20 AM Performed by: Dionne Bucy Pre-anesthesia Checklist: Patient identified, Patient being monitored, Timeout performed, Emergency Drugs available and Suction available Patient Re-evaluated:Patient Re-evaluated prior to inductionOxygen Delivery Method: Circle system utilized Preoxygenation: Pre-oxygenation with 100% oxygen Intubation Type: IV induction Ventilation: Mask ventilation without difficulty Laryngoscope Size: 3 and McGraph Grade View: Grade II Tube type: Oral Tube size: 7.0 mm Number of attempts: 3 (DL x1 with MAC 3 only able to visualize epiglottis; DL x2  with Mil 2 and epiglottis only visualized; DL x3 wit Macgrath 3 grade 2 view and note to be very anterior;; + Bilat equal breath sounds and + etco2) Airway Equipment and Method: Stylet and Video-laryngoscopy Placement Confirmation: ETT inserted through vocal cords under direct vision,  positive ETCO2 and breath sounds checked- equal and bilateral Secured at: 21 cm Tube secured with: Tape Dental Injury: Teeth and Oropharynx as per pre-operative assessment  Difficulty Due To: Difficult Airway- due to anterior larynx

## 2015-11-29 NOTE — Progress Notes (Signed)
Awake. Unable to use bedpan. Bedpan removed.

## 2015-11-29 NOTE — Anesthesia Preprocedure Evaluation (Signed)
Anesthesia Evaluation  Patient identified by MRN, date of birth, ID band Patient awake    Reviewed: Allergy & Precautions, NPO status , Patient's Chart, lab work & pertinent test results  Airway Mallampati: II  TM Distance: <3 FB     Dental  (+) Teeth Intact   Pulmonary shortness of breath, former smoker,    breath sounds clear to auscultation       Cardiovascular Exercise Tolerance: Good hypertension, Pt. on home beta blockers  Rhythm:Regular Rate:Normal     Neuro/Psych Anxiety    GI/Hepatic negative GI ROS, Neg liver ROS,   Endo/Other  diabetes, Well Controlled, Type 2, Oral Hypoglycemic AgentsHypothyroidism Morbid obesity  Renal/GU      Musculoskeletal   Abdominal (+) + obese,   Peds  Hematology negative hematology ROS (+)   Anesthesia Other Findings   Reproductive/Obstetrics                             Anesthesia Physical Anesthesia Plan  ASA: III  Anesthesia Plan: General   Post-op Pain Management:    Induction: Intravenous  Airway Management Planned: Oral ETT  Additional Equipment:   Intra-op Plan:   Post-operative Plan: Extubation in OR  Informed Consent: I have reviewed the patients History and Physical, chart, labs and discussed the procedure including the risks, benefits and alternatives for the proposed anesthesia with the patient or authorized representative who has indicated his/her understanding and acceptance.     Plan Discussed with: CRNA  Anesthesia Plan Comments:         Anesthesia Quick Evaluation

## 2015-11-29 NOTE — Anesthesia Postprocedure Evaluation (Signed)
Anesthesia Post Note  Patient: Diamond Sosa  Procedure(s) Performed: Procedure(s) (LRB): NEPHROLITHOTOMY PERCUTANEOUS WITH URETERAL STENT PLACEMENT /BIOPSIES (Left)  Patient location during evaluation: PACU Anesthesia Type: General Level of consciousness: awake and alert Pain management: satisfactory to patient Vital Signs Assessment: post-procedure vital signs reviewed and stable Cardiovascular status: stable Anesthetic complications: no    Last Vitals:  Filed Vitals:   11/29/15 1300 11/29/15 1315  BP: 171/72 162/77  Pulse: 81 79  Temp:    Resp: 18 18    Last Pain:  Filed Vitals:   11/29/15 1320  PainSc: 6                  VAN STAVEREN,Chantella Creech

## 2015-11-29 NOTE — Discharge Instructions (Signed)
You have a ureteral stent in place.  This is a tube that extends from your kidney to your bladder.  This may cause urinary bleeding, burning with urination, and urinary frequency.  Please call our office or present to the ED if you develop fevers >101 or pain which is not able to be controlled with oral pain medications.  You may be given either Flomax and/ or ditropan to help with bladder spasms and stent pain in addition to pain medications.    You will leak from your flank incision for several days. In the beginning, this to be significant may be bloody. Improve with each day.  Daily wound covered in order to keep your clothes dry. You may shower, reapply dressing thereafter.    [facility] [address] [place], [place] [address] [phone]

## 2015-11-29 NOTE — OR Nursing (Signed)
Pt reports urge to void place on bedpan but discussed possible bladder spasm due to procedure instead of actual full bladder.

## 2015-11-29 NOTE — Progress Notes (Signed)
Pharmacy Consult- Antibiotic Renal Dose adjustment.  No current antibiotics ordered.  Will continue to follow.  Chinita Greenland PharmD Clinical Pharmacist 11/29/2015   No current antibioticarmacy consult - antibiotic renal dose adjustment

## 2015-11-30 DIAGNOSIS — N200 Calculus of kidney: Secondary | ICD-10-CM | POA: Diagnosis not present

## 2015-11-30 DIAGNOSIS — N132 Hydronephrosis with renal and ureteral calculous obstruction: Secondary | ICD-10-CM | POA: Diagnosis not present

## 2015-11-30 LAB — BASIC METABOLIC PANEL
ANION GAP: 7 (ref 5–15)
BUN: 12 mg/dL (ref 6–20)
CALCIUM: 7.9 mg/dL — AB (ref 8.9–10.3)
CHLORIDE: 105 mmol/L (ref 101–111)
CO2: 27 mmol/L (ref 22–32)
CREATININE: 0.66 mg/dL (ref 0.44–1.00)
GFR calc non Af Amer: >60 mL/min (ref >60)
Glucose, Bld: 168 mg/dL — ABNORMAL HIGH (ref 65–99)
Potassium: 3.6 mmol/L (ref 3.5–5.1)
SODIUM: 139 mmol/L (ref 135–145)

## 2015-11-30 LAB — CBC
HEMATOCRIT: 34.8 % — AB (ref 35.0–47.0)
HEMOGLOBIN: 11.8 g/dL — AB (ref 12.0–16.0)
MCH: 29.7 pg (ref 26.0–34.0)
MCHC: 33.8 g/dL (ref 32.0–36.0)
MCV: 87.9 fL (ref 80.0–100.0)
Platelets: 328 10*3/uL (ref 150–440)
RBC: 3.97 MIL/uL (ref 3.80–5.20)
RDW: 14.3 % (ref 11.5–14.5)
WBC: 15.5 10*3/uL — ABNORMAL HIGH (ref 3.6–11.0)

## 2015-11-30 LAB — GLUCOSE, CAPILLARY: GLUCOSE-CAPILLARY: 149 mg/dL — AB (ref 65–99)

## 2015-11-30 LAB — SURGICAL PATHOLOGY

## 2015-11-30 MED ORDER — ALPRAZOLAM 0.5 MG PO TABS
0.5000 mg | ORAL_TABLET | Freq: Once | ORAL | Status: AC
  Administered 2015-11-30: 0.5 mg via ORAL
  Filled 2015-11-30: qty 1

## 2015-11-30 NOTE — Discharge Summary (Signed)
Date of admission: 11/29/2015  Date of discharge: 11/30/2015  Admission diagnosis: Left nephrolithiasis  Discharge diagnosis: Left nephrolithiasis Left upper tract urothelial carcinoma  Secondary diagnoses:  Patient Active Problem List   Diagnosis Date Noted  . Kidney stone 11/29/2015  . 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 Type 2 diabetes mellitus ([place]) 02/01/2014  . SINUSITIS- ACUTE-NOS 04/28/2007  . HYPERLIPIDEMIA 03/25/2007  . HYPERTENSION 03/25/2007  . VENOUS INSUFFICIENCY, CHRONIC 03/25/2007  . 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 GLAUCOMA 02/27/2007  . ALLERGIC RHINITIS 02/27/2007  . SPRAIN/STRAIN, KNEE/LEG NEC 12/18/2006    History and Physical: For full details, please see admission history and physical. Briefly, Diamond Sosa is a 60 y.o. year old patient with Significant left-sided stone burden who underwent left PCNL for treatment of the stones. Incidentally, an area suspicious for carcinoma was identified and biopsied intraoperatively.[name]   Hospital Course: Patient tolerated the procedure well.  She was then transferred to the floor after an uneventful PACU stay.  Her hospital course was uncomplicated.  On the morning of postop day 1, her Foley catheter was removed. Her nephrostomy tube was clamped for several hours and she had no increased flank pain. These were subsequently removed.  On POD#1 she had met discharge criteria: was eating a regular diet, was up and ambulating independently,  pain was well controlled, was voiding without a catheter, and was ready to for discharge.   Laboratory values:   Recent Labs  11/29/15 1307 11/30/15 0522  WBC 14.5* 15.5*  HGB 13.1 11.8*  HCT 40.1 34.8*    Recent Labs  11/29/15 1307 11/30/15 0522  NA 139 139  K 3.9 3.6  CL 107 105  CO2 24 27  GLUCOSE 203* 168*  BUN 15 12  CREATININE 0.79 0.66  CALCIUM 8.5* 7.9*   No results for input(s): LABPT, INR in the last 72 hours. No results for input(s): LABURIN in the last 72  hours. Results for orders placed or performed in visit on 10/12/15  Microscopic Examination     Status: Abnormal   Collection Time: 10/12/15  8:56 AM  Result Value Ref Range Status   WBC, UA 11-30 (A) 0 -  5 /hpf Final   RBC, UA >30 (A) 0 -  2 /hpf Final   Epithelial Cells (non renal) 0-10 0 - 10 /hpf Final   Mucus, UA Present (A) Not Estab. Final   Bacteria, UA None seen None seen/Few Final  CULTURE, URINE COMPREHENSIVE     Status: None   Collection Time: 10/12/15  9:33 AM  Result Value Ref Range Status   Urine Culture, Comprehensive Final report  Final   Result 1 Comment  Final    Comment: Mixed urogenital flora 10,000-25,000 colony forming units per mL     Disposition: Home  Discharge instruction: The patient was instructed to be ambulatory but told to refrain from heavy lifting, strenuous activity, or driving While taking narcotic medication.  Discharge medications:   Medication List    TAKE these medications        cholecalciferol 1000 units tablet  Commonly known as:  VITAMIN D  Take 2,000 Units by mouth daily after supper.     docusate sodium 100 MG capsule  Commonly known as:  COLACE  Take 1 capsule (100 mg total) by mouth 2 (two) times daily.     HYDROcodone-acetaminophen 5-325 MG tablet  Commonly known as:  NORCO/VICODIN  Take 1-2 tablets by mouth every 6 (six) hours as needed for moderate pain.  loratadine 10 MG tablet  Commonly known as:  CLARITIN  Take 10 mg by mouth daily as needed for allergies.     metFORMIN 750 MG 24 hr tablet  Commonly known as:  GLUCOPHAGE-XR  Take 750 mg by mouth 2 (two) times daily.     metoprolol succinate 50 MG 24 hr tablet  Commonly known as:  TOPROL-XL  Take 50 mg by mouth daily. Take with or immediately following a meal at 8pm.     oxybutynin 5 MG tablet  Commonly known as:  DITROPAN  Take 1 tablet (5 mg total) by mouth every 8 (eight) hours as needed for bladder spasms.     tamsulosin 0.4 MG Caps capsule   Commonly known as:  FLOMAX  Take 1 capsule (0.4 mg total) by mouth daily.        Followup:      Follow-up Information    Follow up with Hollice Espy, MD In 2 weeks.   Specialty:  Urology   Why:  For cystoscopy/ stent removal   Contact information:   [address] [place] [address] [phone]

## 2015-11-30 NOTE — Progress Notes (Signed)
Pt stable. IV removed. D/c instructions given and education provided. Signed prescriptions verified and given. Pt states she understands instructions. Necessary supplies given for drsg change with paper chucks. Pt dressed and escorted out by staff. Driven home by family.

## 2015-11-30 NOTE — Progress Notes (Signed)
Urology Consult Follow Up  Subjective: Patient had an episode of nauseous at 4 am.  She believes that she became nauseous after taking a pain medication on an empty stomach.  She does have an appetite and believes she will feel better if she eats.  She is also having pain in her left flank, but she did fall on her left side getting back into the bed last evening.  Nephrostomy tube is not clamped.  Red urine draining from the tube.  VSS afebrile.  UOP good.    Anti-infectives: Anti-infectives    Start     Dose/Rate Route Frequency Ordered Stop   11/29/15 0754  clindamycin (CLEOCIN) 300 MG/50ML IVPB    Comments:  PATTERSON, ALYSON: cabinet override      11/29/15 0754 11/29/15 1959   11/29/15 0645  ciprofloxacin (CIPRO) IVPB 400 mg  Status:  Discontinued     400 mg 200 mL/hr over 60 Minutes Intravenous To Radiology 11/29/15 0630 11/29/15 1348   11/29/15 0100  ceFAZolin (ANCEF) IVPB 2g/100 mL premix  Status:  Discontinued     2 g 200 mL/hr over 30 Minutes Intravenous  Once 11/29/15 0057 11/29/15 1348      Current Facility-Administered Medications  Medication Dose Route Frequency Provider Last Rate Last Dose  . 0.9 %  sodium chloride infusion   Intravenous Continuous Hollice Espy, MD 125 mL/hr at 11/30/15 [phone]    . acetaminophen (TYLENOL) tablet 650 mg  650 mg Oral Q4H PRN Hollice Espy, MD      . diphenhydrAMINE (BENADRYL) injection 12.5 mg  12.5 mg Intravenous Q6H PRN Hollice Espy, MD       Or  . diphenhydrAMINE (BENADRYL) 12.5 MG/5ML elixir 12.5 mg  12.5 mg Oral Q6H PRN Hollice Espy, MD      . docusate sodium (COLACE) capsule 100 mg  100 mg Oral BID Hollice Espy, MD   100 mg at 11/29/15 1400  . fentaNYL (SUBLIMAZE) injection 50 mcg  50 mcg Intravenous Once Gijsbertus Lonia Mad, MD   50 mcg at 11/29/15 0945  . heparin injection 5,000 Units  5,000 Units Subcutaneous Q8H Hollice Espy, MD   5,000 Units at 11/30/15 [phone]  . loratadine (CLARITIN) tablet 10 mg  10 mg Oral Daily  PRN Hollice Espy, MD      . metFORMIN (GLUCOPHAGE-XR) 24 hr tablet 750 mg  750 mg Oral BID Hollice Espy, MD   750 mg at 11/29/15 2103  . metoprolol succinate (TOPROL-XL) 24 hr tablet 50 mg  50 mg Oral Daily Hollice Espy, MD   50 mg at 11/29/15 2104  . morphine 2 MG/ML injection 2-4 mg  2-4 mg Intravenous Q2H PRN Hollice Espy, MD   2 mg at 11/30/15 [phone]  . ondansetron (ZOFRAN) injection 4 mg  4 mg Intravenous Q4H PRN Hollice Espy, MD   4 mg at 11/30/15 0359  . opium-belladonna (B&O SUPPRETTES) 16.2-60 MG suppository 1 suppository  1 suppository Rectal Q6H PRN Hollice Espy, MD      . oxybutynin (DITROPAN) tablet 5 mg  5 mg Oral Q8H PRN Hollice Espy, MD      . oxyCODONE-acetaminophen (PERCOCET/ROXICET) 5-325 MG per tablet 1-2 tablet  1-2 tablet Oral Q4H PRN Hollice Espy, MD   2 tablet at 11/29/15 2252     Objective: Vital signs in last 24 hours: Temp:  [98 F (36.7 C)-98.8 F (37.1 C)] 98.3 F (36.8 C) (06/21 0402) Pulse Rate:  [74-107] 87 (06/21 0402) Resp:  [14-22] 20 (06/21 0402) BP: (  121-171)/(46-103) 140/56 mmHg (06/21 0402) SpO2:  [93 %-100 %] 96 % (06/21 0402)  Intake/Output from previous day: 06/20 0701 - 06/21 0700 In: 2092 [P.O.:360; I.V.:1732] Out: 1600 [Urine:1400; Blood:200] Intake/Output this shift:     Physical Exam Constitutional: Well nourished. Alert and oriented, No acute distress. HEENT: [place] AT, moist mucus membranes. Trachea midline, no masses. Cardiovascular: No clubbing, cyanosis, or edema. Respiratory: Normal respiratory effort, no increased work of breathing. GI: Abdomen is soft, non tender, non distended, no abdominal masses. Liver and spleen not palpable.  No hernias appreciated.  Stool sample for occult testing is not indicated.   GU: Left CVA tenderness.  No flank ecchymosis.  Bandage with serosanguinous drainage.  Nephrostomy tube and ureteral cath in place.  No bladder fullness or masses.   Skin: No rashes, bruises or suspicious  lesions. Lymph: No cervical or inguinal adenopathy. Neurologic: Grossly intact, no focal deficits, moving all 4 extremities. Psychiatric: Normal mood and affect.  Lab Results:   Recent Labs  11/29/15 1307 11/30/15 0522  WBC 14.5* 15.5*  HGB 13.1 11.8*  HCT 40.1 34.8*  PLT 257 328   BMET  Recent Labs  11/29/15 1307 11/30/15 0522  NA 139 139  K 3.9 3.6  CL 107 105  CO2 24 27  GLUCOSE 203* 168*  BUN 15 12  CREATININE 0.79 0.66  CALCIUM 8.5* 7.9*   PT/INR No results for input(s): LABPROT, INR in the last 72 hours. ABG No results for input(s): PHART, HCO3 in the last 72 hours.  Invalid input(s): PCO2, PO2  Studies/Results: Dg Abd 1 View  11/29/2015  CLINICAL DATA:  Left nephrolithiasis EXAM: DG C-ARM 61-120 MIN; ABDOMEN - 1 VIEW COMPARISON:  11/29/2015 FINDINGS: Multiple low intraoperative spot images demonstrate placement of left nephrostomy catheter and a left ureteral stent. IMPRESSION: Percutaneous access to the left renal collecting system and placement of left ureteral stent. Electronically Signed   By: Rolm Baptise M.D.   On: 11/29/2015 12:27   [place] Intraoperative  11/29/2015  CLINICAL DATA:  Ultrasound was provided for use by the ordering physician, and a technical charge was applied by the performing facility.  No radiologist interpretation/professional services rendered.   Dg C-arm 61-120 Min  11/29/2015  CLINICAL DATA:  Left nephrolithiasis EXAM: DG C-ARM 61-120 MIN; ABDOMEN - 1 VIEW COMPARISON:  11/29/2015 FINDINGS: Multiple low intraoperative spot images demonstrate placement of left nephrostomy catheter and a left ureteral stent. IMPRESSION: Percutaneous access to the left renal collecting system and placement of left ureteral stent. Electronically Signed   By: Rolm Baptise M.D.   On: 11/29/2015 12:27   Ir Nephrostomy Placement Left  11/29/2015  INDICATION: Left nephrolithiasis EXAM: IR NEPHROSTOMY PLACEMENT LEFT COMPARISON:  None. MEDICATIONS: [place]ocin; The  antibiotic was administered in an appropriate time frame prior to skin puncture. ANESTHESIA/SEDATION: Fentanyl 200 mcg IV; Versed 5 mg IV Moderate Sedation Time:  40 The patient was continuously monitored during the procedure by the interventional radiology nurse under my direct supervision. CONTRAST:  55mL ISOVUE-300 IOPAMIDOL (ISOVUE-300) INJECTION 61% - administered into the collecting system(s) FLUOROSCOPY TIME:  Fluoroscopy Time: 16 minutes 30 seconds (2.7 mGy). COMPLICATIONS: None immediate. PROCEDURE: Informed written consent was obtained from the patient after a thorough discussion of the procedural risks, benefits and alternatives. All questions were addressed. Maximal Sterile Barrier Technique was utilized including caps, mask, sterile gowns, sterile gloves, sterile drape, hand hygiene and skin antiseptic. A timeout was performed prior to the initiation of the procedure. The left back was prepped  and draped in a sterile fashion. 1% lidocaine was utilized for local anesthesia. Spot images were obtained. Under fluoroscopic guidance, a 21 gauge needle was inserted into the renal pelvis. Contrast and air were injected. A 21 gauge needle was then advanced into the lower pole calyx under fluoroscopic guidance and removed over a 018 wire which was up sized to a 3 J. a cobra 2 catheter was inserted. A glidewire was then advanced through the Cobra catheter and into the ureter. The cobra catheter was advanced over the glidewire to the distal ureter. It was sewn in place. FINDINGS: The initial series of images demonstrate a large renal calculus in the pelvis is well as a small are calculus in the lower pole calyx. Imaging demonstrates access into the left renal collecting system via a lower pole calyx. The final image demonstrates a nephro ureteral catheter extending to the bladder. IMPRESSION: Successful left lower pole nephro ureteral catheter placement. Electronically Signed   By: Marybelle Killings M.D.   On:  11/29/2015 09:31     Assessment: s/p Procedure(s): NEPHROLITHOTOMY PERCUTANEOUS WITH URETERAL STENT PLACEMENT /BIOPSIES 60 y.o. patient with gross hematuria and 2 large left stones measuring 1.2 cm and 1.9 cm who underwent left PCNL and biopsies on  11/29/2015.   Plan:  - Foley is discontinued this am  - Nephrostomy tube is plugged  - Dressings changed  - Patient concerned about intra-operative findings; explained that biopsy results were not available at this time      [facility] 11/30/2015

## 2015-11-30 NOTE — Progress Notes (Signed)
To whom it may concern,  Mrs. Diamond Sosa was admitted to [facility] on 11/29/15 and discharged on 11/30/15. Please excuse [name] from work from 11/29/15 through 12/08/15 for hospitalization and recovery time.  Thank you,  Abbygail [place] D. Abran Richard, BSN, RN

## 2015-12-06 LAB — GLUCOSE, CAPILLARY: GLUCOSE-CAPILLARY: 200 mg/dL — AB (ref 65–99)

## 2015-12-08 LAB — STONE ANALYSIS
CA OXALATE, DIHYDRATE: 15 %
CA PHOS CRY STONE QL IR: 10 %
Ca Oxalate,Monohydr.: 75 %
Stone Weight KSTONE: 128 mg

## 2015-12-10 ENCOUNTER — Emergency Department
Admission: EM | Admit: 2015-12-10 | Discharge: 2015-12-10 | Disposition: A | Discharge status: Home / Self Care | Payer: BLUE CROSS/BLUE SHIELD | Attending: Emergency Medicine | Admitting: Emergency Medicine

## 2015-12-10 ENCOUNTER — Encounter: Payer: Self-pay | Admitting: *Deleted

## 2015-12-10 ENCOUNTER — Emergency Department: Payer: BLUE CROSS/BLUE SHIELD

## 2015-12-10 DIAGNOSIS — R109 Unspecified abdominal pain: Secondary | ICD-10-CM | POA: Diagnosis present

## 2015-12-10 DIAGNOSIS — Z79899 Other long term (current) drug therapy: Secondary | ICD-10-CM | POA: Insufficient documentation

## 2015-12-10 DIAGNOSIS — I10 Essential (primary) hypertension: Secondary | ICD-10-CM | POA: Diagnosis not present

## 2015-12-10 DIAGNOSIS — Z7984 Long term (current) use of oral hypoglycemic drugs: Secondary | ICD-10-CM | POA: Diagnosis not present

## 2015-12-10 DIAGNOSIS — E039 Hypothyroidism, unspecified: Secondary | ICD-10-CM | POA: Diagnosis not present

## 2015-12-10 DIAGNOSIS — E119 Type 2 diabetes mellitus without complications: Secondary | ICD-10-CM | POA: Insufficient documentation

## 2015-12-10 DIAGNOSIS — N390 Urinary tract infection, site not specified: Secondary | ICD-10-CM | POA: Insufficient documentation

## 2015-12-10 DIAGNOSIS — N399 Disorder of urinary system, unspecified: Secondary | ICD-10-CM

## 2015-12-10 DIAGNOSIS — Z87891 Personal history of nicotine dependence: Secondary | ICD-10-CM | POA: Insufficient documentation

## 2015-12-10 DIAGNOSIS — M1990 Unspecified osteoarthritis, unspecified site: Secondary | ICD-10-CM | POA: Diagnosis not present

## 2015-12-10 LAB — CBC
HEMATOCRIT: 35 % (ref 35.0–47.0)
HEMOGLOBIN: 11.7 g/dL — AB (ref 12.0–16.0)
MCH: 29.1 pg (ref 26.0–34.0)
MCHC: 33.5 g/dL (ref 32.0–36.0)
MCV: 86.7 fL (ref 80.0–100.0)
Platelets: 278 10*3/uL (ref 150–440)
RBC: 4.03 MIL/uL (ref 3.80–5.20)
RDW: 14 % (ref 11.5–14.5)
WBC: 12.9 10*3/uL — AB (ref 3.6–11.0)

## 2015-12-10 LAB — URINALYSIS COMPLETE WITH MICROSCOPIC (ARMC ONLY)
Bilirubin Urine: NEGATIVE
Glucose, UA: NEGATIVE mg/dL
Ketones, ur: NEGATIVE mg/dL
NITRITE: POSITIVE — AB
PH: 6 (ref 5.0–8.0)
PROTEIN: 30 mg/dL — AB
SPECIFIC GRAVITY, URINE: 1.006 (ref 1.005–1.030)

## 2015-12-10 LAB — LIPASE, BLOOD: Lipase: 20 U/L (ref 11–51)

## 2015-12-10 LAB — COMPREHENSIVE METABOLIC PANEL
ALBUMIN: 3.5 g/dL (ref 3.5–5.0)
ALK PHOS: 84 U/L (ref 38–126)
ALT: 19 U/L (ref 14–54)
AST: 12 U/L — AB (ref 15–41)
Anion gap: 9 (ref 5–15)
BILIRUBIN TOTAL: 0.6 mg/dL (ref 0.3–1.2)
BUN: 9 mg/dL (ref 6–20)
CO2: 26 mmol/L (ref 22–32)
Calcium: 8.8 mg/dL — ABNORMAL LOW (ref 8.9–10.3)
Chloride: 99 mmol/L — ABNORMAL LOW (ref 101–111)
Creatinine, Ser: 0.75 mg/dL (ref 0.44–1.00)
GFR calc Af Amer: >60 mL/min (ref >60)
GLUCOSE: 172 mg/dL — AB (ref 65–99)
Potassium: 3.6 mmol/L (ref 3.5–5.1)
Sodium: 134 mmol/L — ABNORMAL LOW (ref 135–145)
TOTAL PROTEIN: 7 g/dL (ref 6.5–8.1)

## 2015-12-10 MED ORDER — ACETAMINOPHEN 500 MG PO TABS
1000.0000 mg | ORAL_TABLET | Freq: Once | ORAL | Status: AC
  Administered 2015-12-10: 1000 mg via ORAL

## 2015-12-10 MED ORDER — ONDANSETRON HCL 4 MG/2ML IJ SOLN
4.0000 mg | Freq: Once | INTRAMUSCULAR | Status: AC
  Administered 2015-12-10: 4 mg via INTRAVENOUS
  Filled 2015-12-10: qty 2

## 2015-12-10 MED ORDER — LEVOFLOXACIN IN D5W 750 MG/150ML IV SOLN
750.0000 mg | INTRAVENOUS | Status: DC
  Administered 2015-12-10: 750 mg via INTRAVENOUS
  Filled 2015-12-10: qty 150

## 2015-12-10 MED ORDER — SODIUM CHLORIDE 0.9 % IV BOLUS (SEPSIS)
500.0000 mL | Freq: Once | INTRAVENOUS | Status: AC
  Administered 2015-12-10: 500 mL via INTRAVENOUS

## 2015-12-10 MED ORDER — ACETAMINOPHEN 500 MG PO TABS
ORAL_TABLET | ORAL | Status: AC
  Administered 2015-12-10: 1000 mg via ORAL
  Filled 2015-12-10: qty 2

## 2015-12-10 MED ORDER — CIPROFLOXACIN HCL 500 MG PO TABS: 500.0000 mg | ORAL_TABLET | Freq: Two times a day (BID) | ORAL | Status: AC

## 2015-12-10 MED ORDER — HYDROMORPHONE HCL 1 MG/ML IJ SOLN
1.0000 mg | Freq: Once | INTRAMUSCULAR | Status: AC
  Administered 2015-12-10: 1 mg via INTRAVENOUS
  Filled 2015-12-10: qty 1

## 2015-12-10 NOTE — ED Provider Notes (Signed)
Time Seen: Approximately 1850  I have reviewed the triage notes  Chief Complaint: Abdominal Pain   History of Present Illness: Diamond Sosa is a 60 y.o. female *who presents with feelings of rigors at home. Patient felt febrile with body chills and generalized body aches with some nausea no persistent vomiting. She has a significant recent surgical history of nephrostomy with resection of large left-sided kidney stones. 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 The patient has a left-sided stent. She denies any chest pain, shortness of breath. States her urine has a foul smell to it is not currently on any antibiotic therapy. Patient denies any headaches, photophobia, neck stiffness. She states her operative wound is healed well without any surrounding erythema or drainage.   Past Medical History  Diagnosis Date  . Diabetes mellitus without complication ([place])   . Hypertension   . Thyroid disease     hypothyroid  . 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[place])   . IBS (irritable bowel syndrome)   . Microadenoma ([place])   . Cataracts, bilateral   . Adrenal tumor   . Kidney stone   . Inflammatory bowel disease   . Shortness of breath dyspnea   . Dysrhythmia   . Anxiety   . Complication of anesthesia 2002    during galbladder surgery, pt was extubated in OR and did not breath, needed to be reintubated, pt remembers this and is concerned this will happen again.  [name] Hypothyroidism     boarderline  . Arthritis     generalized    Patient Active Problem List   Diagnosis Date Noted  . Kidney stone 11/29/2015  . 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 Type 2 diabetes mellitus ([place]) 02/01/2014  . SINUSITIS- ACUTE-NOS 04/28/2007  . HYPERLIPIDEMIA 03/25/2007  . HYPERTENSION 03/25/2007  . VENOUS INSUFFICIENCY, CHRONIC 03/25/2007  . 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 GLAUCOMA 02/27/2007  . ALLERGIC RHINITIS 02/27/2007  . SPRAIN/STRAIN, KNEE/LEG NEC 12/18/2006    Past Surgical History   Procedure Laterality Date  . Cholecystectomy    . Tonsillectomy    . Cervical cerclage    . Colonoscopy with propofol N/A 09/13/2015    Procedure: COLONOSCOPY WITH PROPOFOL;  Surgeon: Lollie Sails, MD;  Location: [facility] ENDOSCOPY;  Service: Endoscopy;  Laterality: N/A;  . Wisdom tooth extraction Bilateral     all 4  . Nephrolithotomy Left 11/29/2015    Procedure: NEPHROLITHOTOMY PERCUTANEOUS WITH URETERAL STENT PLACEMENT /BIOPSIES;  Surgeon: Hollice Espy, MD;  Location: [facility] ORS;  Service: Urology;  Laterality: Left;    Past Surgical History  Procedure Laterality Date  . Cholecystectomy    . Tonsillectomy    . Cervical cerclage    . Colonoscopy with propofol N/A 09/13/2015    Procedure: COLONOSCOPY WITH PROPOFOL;  Surgeon: Lollie Sails, MD;  Location: [facility] ENDOSCOPY;  Service: Endoscopy;  Laterality: N/A;  . Wisdom tooth extraction Bilateral     all 4  . Nephrolithotomy Left 11/29/2015    Procedure: NEPHROLITHOTOMY PERCUTANEOUS WITH URETERAL STENT PLACEMENT /BIOPSIES;  Surgeon: Hollice Espy, MD;  Location: [facility] ORS;  Service: Urology;  Laterality: Left;    Current Outpatient Rx  Name  Route  Sig  Dispense  Refill  . cholecalciferol (VITAMIN D) 1000 UNITS tablet   Oral   Take 2,000 Units by mouth daily after supper.          . docusate sodium (COLACE) 100 MG capsule   Oral   Take 1 capsule (100 mg total) by mouth  2 (two) times daily.   60 capsule   0   . HYDROcodone-acetaminophen (NORCO/VICODIN) 5-325 MG tablet   Oral   Take 1-2 tablets by mouth every 6 (six) hours as needed for moderate pain.   15 tablet   0   . loratadine (CLARITIN) 10 MG tablet   Oral   Take 10 mg by mouth daily as needed for allergies.         . metFORMIN (GLUCOPHAGE-XR) 750 MG 24 hr tablet   Oral   Take 750 mg by mouth 2 (two) times daily.         . metoprolol succinate (TOPROL-XL) 50 MG 24 hr tablet   Oral   Take 50 mg by mouth daily. Take with or immediately following a meal at  8pm.         . oxybutynin (DITROPAN) 5 MG tablet   Oral   Take 1 tablet (5 mg total) by mouth every 8 (eight) hours as needed for bladder spasms.   30 tablet   0   . tamsulosin (FLOMAX) 0.4 MG CAPS capsule   Oral   Take 1 capsule (0.4 mg total) by mouth daily.   30 capsule   0     Allergies:  Bee venom; Adhesive; Cefuroxime axetil; and Erythromycin base  Family History: Family History  Problem Relation Age of Onset  . Prostate cancer Father   . Urolithiasis Mother   . Kidney disease Neg Hx   . Kidney cancer Neg Hx     Social History: Social History  Substance Use Topics  . Smoking status: Former Smoker    Quit date: 07/31/2000  . Smokeless tobacco: None  . Alcohol Use: No     Review of Systems:   10 point review of systems was performed and was otherwise negative:  Constitutional: No fever Eyes: No visual disturbances ENT: No sore throat, ear pain Cardiac: No chest pain Respiratory: No shortness of breath, wheezing, or stridor Abdomen: No abdominal pain, no vomiting, No diarrhea Endocrine: No weight loss, No night sweats Extremities: No peripheral edema, cyanosis Skin: No rashes, easy bruising Neurologic: No focal weakness, trouble with speech or swollowing Urologic: No dysuria, Hematuria, or urinary frequency   Physical Exam:  ED Triage Vitals  Enc Vitals Group     BP 12/10/15 1828 125/66 mmHg     Pulse Rate 12/10/15 1828 110     Resp 12/10/15 1828 20     Temp 12/10/15 1828 102.5 F (39.2 C)     Temp Source 12/10/15 1828 Oral     SpO2 12/10/15 1828 95 %     Weight 12/10/15 1828 236 lb (107.049 kg)     Height 12/10/15 1828 5\' 4"  (1.626 m)     Head Cir --      Peak Flow --      Pain Score 12/10/15 1829 2     Pain Loc --      Pain Edu? --      Excl. in [place]? --     General: Awake , Alert , and Oriented times 3; GCS 15 Head: Normal cephalic , atraumatic Eyes: Pupils equal , round, reactive to light Nose/Throat: No nasal drainage, patent  upper airway without erythema or exudate.  Neck: Supple, Full range of motion, No anterior adenopathy or palpable thyroid masses Lungs: Clear to ascultation without wheezes , rhonchi, or rales Heart: Regular rate, regular rhythm without murmurs , gallops , or rubs Abdomen: Soft, non tender without rebound, guarding ,  or rigidity; bowel sounds positive and symmetric in all 4 quadrants. No organomegaly .        Extremities: 2 plus symmetric pulses. No edema, clubbing or cyanosis Neurologic: normal ambulation, Motor symmetric without deficits, sensory intact Skin: warm, dry, no rashes Visualization of the surgical area shows no erythema or induration or drainage.   Labs:   All laboratory work was reviewed including any pertinent negatives or positives listed below:  Labs Reviewed  COMPREHENSIVE METABOLIC PANEL - Abnormal; Notable for the following:    Sodium 134 (*)    Chloride 99 (*)    Glucose, Bld 172 (*)    Calcium 8.8 (*)    AST 12 (*)    All other components within normal limits  CBC - Abnormal; Notable for the following:    WBC 12.9 (*)    Hemoglobin 11.7 (*)    All other components within normal limits  URINE CULTURE  LIPASE, BLOOD  URINALYSIS COMPLETEWITH MICROSCOPIC ([place])  Patient's review laboratory work showed what appears to be a urinary tract infection Urine culture is pending   Radiology: *     CT RENAL STONE STUDY (Final result) Result time: 12/10/15 [id number]   Final result by Rad Results In Interface (12/10/15 [id number])   Narrative:   CLINICAL DATA: 60 year old female left left flank pain.  EXAM: CT ABDOMEN AND PELVIS WITHOUT CONTRAST  TECHNIQUE: Multidetector CT imaging of the abdomen and pelvis was performed following the standard protocol without IV contrast.  COMPARISON: CT dated 06/17/2015  FINDINGS: Evaluation of this exam is limited in the absence of intravenous contrast.  The visualized lung bases are clear. There is  hypoattenuation of the cardiac blood pool suggestive of a degree of anemia. Clinical correlation is recommended. No intra-abdominal free air or free fluid.  Diffuse fatty infiltration of the liver. Cholecystectomy. Pancreas, spleen, and the right adrenal gland appear unremarkable. There is a 3.6 cm left adrenal gland adenoma or myelo lipoma. There is a left ureteral stent with proximal tip pole collecting system in the distal end within the urinary bladder. No stone identified. There is mild fullness of the left renal collecting system with mild haziness of the left renal sinus fat. Correlation with urinalysis recommended to exclude UTI. The right kidney cyst and the right ureter appear unremarkable. The uterus and ovaries are grossly unremarkable.  There are small scattered sigmoid diverticula without active inflammatory changes. There is apparent thickening of the sigmoid colon likely related to underdistention. Colitis is less likely. There is no evidence of bowel obstruction. Normal appendix.  There is mild aortoiliac atherosclerotic disease. Evaluation of the vasculature is limited on this noncontrast study. No portal venous gas identified. There is mild haziness of the mesentery in the left upper abdomen with a "misty mesentery" appearance. This finding is nonspecific but may be related to underlying inflammatory/infectious etiology. This appearance has decreased compared to prior study. There is no adenopathy.  There is a small fat containing umbilical hernia with a surgical staple. Abdominal wall soft tissues are otherwise unremarkable. No acute osseous pathology.  IMPRESSION: Left-sided ureteral stent with mild fullness of the left renal collecting system. No stone identified. Correlation with urinalysis recommended to exclude superimposed UTI.   Electronically Signed By: Anner Crete M.D. On: 12/10/2015 20:22         I personally reviewed the  radiologic studies     ED Course:  Patient's laboratory work and CAT scan findings point to a left-sided urinary tract infection and/or pyelonephritis.  Patient was started on IV Levaquin here with urine culture pending. She'll be discharged on ciprofloxacin. 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 Patient's renal function is fine her white blood cell counts only slightly elevated and I felt was unlikely to be acutely septic at this time. She was not on antibiotics prior to arrival.    Assessment:  Urinary tract infection History of left-sided nephrostomy Urinary stent   Final Clinical Impression:   Final diagnoses:  Left flank pain     Plan: * Outpatient management Prescription for Cipro Patient was advised to return immediately if condition worsens. Patient was advised to follow up with their primary care physician or other specialized physicians involved in their outpatient care. The patient and/or family member/power of attorney had laboratory results reviewed at the bedside. All questions and concerns were addressed and appropriate discharge instructions were distributed by the nursing staff.            Daymon Larsen, MD 12/10/15 2240

## 2015-12-10 NOTE — ED Notes (Addendum)
Pt to triage via wheelchair.  Pt had a ureteral stent placed November 29 2015.  Pt has abd pain, fever and was dx cancer. Hx  Kidney stones. Pt has appt with urologist on Monday.  Pt also reports feeling weak, no appetite.

## 2015-12-10 NOTE — ED Notes (Signed)
Patient returned from CT via stretcher. Placed on monitor. Husband at bedside.

## 2015-12-12 ENCOUNTER — Encounter: Payer: Self-pay | Admitting: Urology

## 2015-12-12 ENCOUNTER — Ambulatory Visit (INDEPENDENT_AMBULATORY_CARE_PROVIDER_SITE_OTHER): Payer: BLUE CROSS/BLUE SHIELD | Admitting: Urology

## 2015-12-12 VITALS — BP 125/70 | HR 78 | Temp 98.6°F | Ht 64.0 in | Wt 241.0 lb

## 2015-12-12 DIAGNOSIS — C642 Malignant neoplasm of left kidney, except renal pelvis: Secondary | ICD-10-CM

## 2015-12-12 DIAGNOSIS — N200 Calculus of kidney: Secondary | ICD-10-CM

## 2015-12-12 NOTE — Progress Notes (Signed)
12/12/2015 4:01 PM   Diamond Sosa 05/02/1956 [id number]  Referring provider: Juluis Pitch, MD [phone] [place], [place] [address]  Chief Complaint  Patient presents with  . Nephrolithiasis    pre-op    HPI: 60 year old female with a history of 1.9 cm and 1.2 cm left kidney stones who was taken to the operating room on 11/29/2015 for left PCNL for treatment of her stones. Intraoperatively, significant bullous edema was noted but was mildly suspicious for papillary tumor. As such, multile biopsies were taken of these areas including frozen section. 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  An antegrade ureteral stent was placed in anticipation of returning to the operating room for second look ureteroscopy with further biopsies and fulgurations as needed.  Postoperatively, her incision healed quickly and she no longer has any leakage from her flank incision. She does have bladder irritation and dysuria from her stent.  Unfortuantely, over the weekend, she developed fevers and presented on 01/08/2016 to the emergency room. She was diagnosed with pyelonephritis and started on Cipro. She's had no further fevers in the last 4 hours. Urine culture is growing Escherichia coli with sensitivities pending. Noncontrast CT scan was performed at that time which revealed stent in good position in the renal pelvis, ureter, and bladder. There is no residual stone burden.  Overall, she is feeling better.    She is interested in transferring her care to [place] for further treatment.  She has an appointment later this week.    Stone analysis was reviewed today in detail. She admits to not drinking enough water baseline.   PMH: Past Medical History  Diagnosis Date  . Diabetes mellitus without complication ([place])   . Hypertension   . Thyroid disease    hypothyroid  . 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Diamond Sosa)   . IBS (irritable bowel syndrome)   . Microadenoma ([place])   . Cataracts, bilateral   . Adrenal tumor   . Kidney stone   . Inflammatory bowel disease   . Shortness of breath dyspnea   . Dysrhythmia   . Anxiety   . Complication of anesthesia 2002    during galbladder surgery, pt was extubated in OR and did not breath, needed to be reintubated, pt remembers this and is concerned this will happen again.  [name] Hypothyroidism     boarderline  . Arthritis     generalized    Surgical History: Past Surgical History  Procedure Laterality Date  . Cholecystectomy    . Tonsillectomy    . Cervical cerclage    . Colonoscopy with propofol N/A 09/13/2015    Procedure: COLONOSCOPY WITH PROPOFOL;  Surgeon: Diamond Sails, MD;  Location: [facility] ENDOSCOPY;  Service: Endoscopy;  Laterality: N/A;  . Wisdom tooth extraction Bilateral     all 4  . Nephrolithotomy Left 11/29/2015    Procedure: NEPHROLITHOTOMY PERCUTANEOUS WITH URETERAL STENT PLACEMENT /BIOPSIES;  Surgeon: Diamond Espy, MD;  Location: [facility] ORS;  Service: Urology;  Laterality: Left;    Home Medications:    Medication List       This list is accurate as of: 12/12/15  4:01 PM.  Always use your most recent med list.               ALPRAZolam 0.25 MG tablet  Commonly known as:  XANAX  Reported on 12/12/2015     cholecalciferol 1000  units tablet  Commonly known as:  VITAMIN D  Take 2,000 Units by mouth daily after supper. Reported on 12/12/2015     ciprofloxacin 500 MG tablet  Commonly known as:  CIPRO  Take 1 tablet (500 mg total) by mouth 2 (two) times daily.     HYDROcodone-acetaminophen 5-325 MG tablet  Commonly known as:  NORCO/VICODIN  Take 1-2 tablets by mouth every 6 (six) hours as needed for moderate pain.     loratadine 10 MG tablet  Commonly known as:  CLARITIN  Take 10 mg by mouth daily as needed for allergies.     metFORMIN 750 MG 24 hr tablet  Commonly known as:  GLUCOPHAGE-XR    Take 750 mg by mouth 2 (two) times daily.     metoprolol succinate 50 MG 24 hr tablet  Commonly known as:  TOPROL-XL  Take 50 mg by mouth daily. Take with or immediately following a meal at 8pm.     oxybutynin 5 MG tablet  Commonly known as:  DITROPAN  Take 1 tablet (5 mg total) by mouth every 8 (eight) hours as needed for bladder spasms.     tamsulosin 0.4 MG Caps capsule  Commonly known as:  FLOMAX  Take 1 capsule (0.4 mg total) by mouth daily.        Allergies:  Allergies  Allergen Reactions  . Bee Venom Shortness Of Breath  . Adhesive [Tape] Other (See Comments)    Whelp on skin.  . Cefuroxime Axetil     REACTION: GI distress  . Erythromycin Base Nausea Only    Extreme nausea    Family History: Family History  Problem Relation Age of Onset  . Prostate cancer Father   . Urolithiasis Mother   . Kidney disease Neg Hx   . Kidney cancer Neg Hx     Social History:  reports that she quit smoking about 15 years ago. She does not have any smokeless tobacco history on file. She reports that she does not drink alcohol or use illicit drugs.  ROS: UROLOGY Frequent Urination?: Yes Hard to postpone urination?: Yes Burning/pain with urination?: Yes Get up at night to urinate?: Yes Leakage of urine?: Yes Urine stream starts and stops?: No Trouble starting stream?: No Do you have to strain to urinate?: No Blood in urine?: No Urinary tract infection?: Yes Sexually transmitted disease?: No Injury to kidneys or bladder?: No Painful intercourse?: No Weak stream?: No Currently pregnant?: No Vaginal bleeding?: No Last menstrual period?: n  Gastrointestinal Nausea?: Yes Vomiting?: No Indigestion/heartburn?: No Diarrhea?: Yes Constipation?: No  Constitutional Fever: No Night sweats?: No Weight loss?: No Fatigue?: No  Skin Skin rash/lesions?: No Itching?: No  Eyes Blurred vision?: Yes Double vision?: Yes  Ears/Nose/Throat Sore throat?: No Sinus problems?:  No  Hematologic/Lymphatic Swollen glands?: No Easy bruising?: Yes  Cardiovascular Leg swelling?: Yes Chest pain?: No  Respiratory Cough?: No Shortness of breath?: Yes  Endocrine Excessive thirst?: No  Musculoskeletal Back pain?: No Joint pain?: Yes  Neurological Headaches?: No Dizziness?: No  Psychologic Depression?: No Anxiety?: Yes  Physical Exam: BP 125/70 mmHg  Pulse 78  Temp(Src) 98.6 F (37 C)  Ht 5\' 4"  (1.626 m)  Wt 241 lb (109.317 kg)  BMI 41.35 kg/m2  Constitutional:  Alert and oriented, No acute distress.   Accompanied by sister today who is nurse.   HEENT: [place] AT, moist mucus membranes.  Trachea midline, no masses. Cardiovascular: No clubbing, cyanosis, or edema. Respiratory: Normal respiratory effort, no increased work  of breathing. GI: Abdomen is soft, nontender, nondistended, no abdominal masses GU: No CVA tenderness.  Left flank incision healing well. Skin: No rashes, bruises or suspicious lesions. Neurologic: Grossly intact, no focal deficits, moving all 4 extremities. Psychiatric: Normal mood and affect.  Laboratory Data: Lab Results  Component Value Date   WBC 12.9* 12/10/2015   HGB 11.7* 12/10/2015   HCT 35.0 12/10/2015   MCV 86.7 12/10/2015   PLT 278 12/10/2015    Lab Results  Component Value Date   CREATININE 0.75 12/10/2015    Urinalysis Component     Latest Ref Rng 06/17/2015  Specimen Description      URINE, RANDOM  Special Requests      NONE  Culture      MULTIPLE SPECIES PRESENT, SUGGEST RECOLLECTION  Report Status      06/20/2015 FINAL   Component     Latest Ref Rng 12/10/2015  Specimen Description      URINE, CLEAN CATCH  Special Requests      Normal  Culture      >=100,000 COLONIES/mL ESCHERICHIA COLI (A)  Report Status      PENDING    Pertinent Imaging: Study Result     CLINICAL DATA: 60 year old female left left flank pain.  EXAM: CT ABDOMEN AND PELVIS WITHOUT  CONTRAST  TECHNIQUE: Multidetector CT imaging of the abdomen and pelvis was performed following the standard protocol without IV contrast.  COMPARISON: CT dated 06/17/2015  FINDINGS: Evaluation of this exam is limited in the absence of intravenous contrast.  The visualized lung bases are clear. There is hypoattenuation of the cardiac blood pool suggestive of a degree of anemia. Clinical correlation is recommended. No intra-abdominal free air or free fluid.  Diffuse fatty infiltration of the liver. Cholecystectomy. Pancreas, spleen, and the right adrenal gland appear unremarkable. There is a 3.6 cm left adrenal gland adenoma or myelo lipoma. There is a left ureteral stent with proximal tip pole collecting system in the distal end within the urinary bladder. No stone identified. There is mild fullness of the left renal collecting system with mild haziness of the left renal sinus fat. Correlation with urinalysis recommended to exclude UTI. The right kidney cyst and the right ureter appear unremarkable. The uterus and ovaries are grossly unremarkable.  There are small scattered sigmoid diverticula without active inflammatory changes. There is apparent thickening of the sigmoid colon likely related to underdistention. Colitis is less likely. There is no evidence of bowel obstruction. Normal appendix.  There is mild aortoiliac atherosclerotic disease. Evaluation of the vasculature is limited on this noncontrast study. No portal venous gas identified. There is mild haziness of the mesentery in the left upper abdomen with a "misty mesentery" appearance. This finding is nonspecific but may be related to underlying inflammatory/infectious etiology. This appearance has decreased compared to prior study. There is no adenopathy.  There is a small fat containing umbilical hernia with a surgical staple. Abdominal wall soft tissues are otherwise unremarkable. No acute osseous  pathology.  IMPRESSION: Left-sided ureteral stent with mild fullness of the left renal collecting system. No stone identified. Correlation with urinalysis recommended to exclude superimposed UTI.   Electronically Signed  By: Anner Crete M.D.  On: 12/10/2015 20:22    Assessment & Plan:    1. 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 In addition, she was given a copy of her endoscopic photos previously and have recommended bringing these to her evaluation. Briefly discussed options moving forward which will be dependent on the grade of the lesion, size, and stage of her incidental tumor    2. Left nephrolithiasis S/p successful left PNCL Stone analysis reviewed We discussed general stone prevention techniques including drinking plenty water with goal of producing 2.5 L urine daily, increased citric acid intake, avoidance of high oxalate containing foods, and decreased salt intake.  Information about dietary recommendations given today.  Discussed that she would likely benefit from a 123456 urine metabolic analysis in the future   Follow-up as needed with [facility] urological, transitioning care to like [facility]  Diamond Sosa, [place] [address], [place], [place] [address] [phone]

## 2015-12-13 LAB — URINE CULTURE: SPECIAL REQUESTS: NORMAL

## 2015-12-15 LAB — CULTURE, URINE COMPREHENSIVE

## 2016-03-30 ENCOUNTER — Other Ambulatory Visit: Payer: Self-pay | Admitting: Urology

## 2016-03-30 DIAGNOSIS — C689 Malignant neoplasm of urinary organ, unspecified: Secondary | ICD-10-CM

## 2016-04-04 ENCOUNTER — Encounter: Payer: Self-pay | Admitting: Urology

## 2016-04-05 ENCOUNTER — Ambulatory Visit
Admission: RE | Admit: 2016-04-05 | Discharge: 2016-04-05 | Disposition: A | Discharge status: Home / Self Care | Payer: BLUE CROSS/BLUE SHIELD | Source: Ambulatory Visit | Attending: Urology | Admitting: Urology

## 2016-04-05 DIAGNOSIS — N1330 Unspecified hydronephrosis: Secondary | ICD-10-CM | POA: Insufficient documentation

## 2016-04-05 DIAGNOSIS — D3502 Benign neoplasm of left adrenal gland: Secondary | ICD-10-CM | POA: Insufficient documentation

## 2016-04-05 DIAGNOSIS — C689 Malignant neoplasm of urinary organ, unspecified: Secondary | ICD-10-CM

## 2016-04-05 DIAGNOSIS — R8299 Other abnormal findings in urine: Secondary | ICD-10-CM | POA: Diagnosis not present

## 2016-04-05 MED ORDER — IOPAMIDOL (ISOVUE-300) INJECTION 61%
125.0000 mL | Freq: Once | INTRAVENOUS | Status: AC | PRN
  Administered 2016-04-05: 125 mL via INTRAVENOUS

## 2016-04-25 ENCOUNTER — Ambulatory Visit: Admit: 2016-04-25 | Payer: BLUE CROSS/BLUE SHIELD | Admitting: Ophthalmology

## 2016-04-25 SURGERY — PHACOEMULSIFICATION, CATARACT, WITH IOL INSERTION
Anesthesia: Choice | Laterality: Left

## 2017-04-09 ENCOUNTER — Emergency Department: Payer: BLUE CROSS/BLUE SHIELD

## 2017-04-09 ENCOUNTER — Encounter: Payer: Self-pay | Admitting: Emergency Medicine

## 2017-04-09 ENCOUNTER — Emergency Department
Admission: EM | Admit: 2017-04-09 | Discharge: 2017-04-10 | Disposition: A | Discharge status: Home / Self Care | Payer: BLUE CROSS/BLUE SHIELD | Attending: Emergency Medicine | Admitting: Emergency Medicine

## 2017-04-09 DIAGNOSIS — Z87891 Personal history of nicotine dependence: Secondary | ICD-10-CM | POA: Insufficient documentation

## 2017-04-09 DIAGNOSIS — R112 Nausea with vomiting, unspecified: Secondary | ICD-10-CM | POA: Insufficient documentation

## 2017-04-09 DIAGNOSIS — R300 Dysuria: Secondary | ICD-10-CM | POA: Diagnosis not present

## 2017-04-09 DIAGNOSIS — N23 Unspecified renal colic: Secondary | ICD-10-CM | POA: Diagnosis not present

## 2017-04-09 DIAGNOSIS — M79604 Pain in right leg: Secondary | ICD-10-CM | POA: Diagnosis not present

## 2017-04-09 DIAGNOSIS — Z7984 Long term (current) use of oral hypoglycemic drugs: Secondary | ICD-10-CM | POA: Insufficient documentation

## 2017-04-09 DIAGNOSIS — E039 Hypothyroidism, unspecified: Secondary | ICD-10-CM | POA: Diagnosis not present

## 2017-04-09 DIAGNOSIS — I10 Essential (primary) hypertension: Secondary | ICD-10-CM | POA: Diagnosis not present

## 2017-04-09 DIAGNOSIS — N200 Calculus of kidney: Secondary | ICD-10-CM | POA: Insufficient documentation

## 2017-04-09 DIAGNOSIS — R1031 Right lower quadrant pain: Secondary | ICD-10-CM | POA: Diagnosis present

## 2017-04-09 DIAGNOSIS — E119 Type 2 diabetes mellitus without complications: Secondary | ICD-10-CM | POA: Diagnosis not present

## 2017-04-09 LAB — CBC WITH DIFFERENTIAL/PLATELET
Basophils Absolute: 0.1 10*3/uL (ref 0–0.1)
Basophils Relative: 1 %
EOS ABS: 0.2 10*3/uL (ref 0–0.7)
Eosinophils Relative: 2 %
HCT: 39.5 % (ref 35.0–47.0)
Hemoglobin: 13 g/dL (ref 12.0–16.0)
LYMPHS ABS: 1.9 10*3/uL (ref 1.0–3.6)
LYMPHS PCT: 14 %
MCH: 29 pg (ref 26.0–34.0)
MCHC: 32.9 g/dL (ref 32.0–36.0)
MCV: 88.3 fL (ref 80.0–100.0)
MONO ABS: 0.7 10*3/uL (ref 0.2–0.9)
Monocytes Relative: 5 %
Neutro Abs: 10.4 10*3/uL — ABNORMAL HIGH (ref 1.4–6.5)
Neutrophils Relative %: 78 %
PLATELETS: 318 10*3/uL (ref 150–440)
RBC: 4.47 MIL/uL (ref 3.80–5.20)
RDW: 13.6 % (ref 11.5–14.5)
WBC: 13.4 10*3/uL — AB (ref 3.6–11.0)

## 2017-04-09 LAB — BASIC METABOLIC PANEL
Anion gap: 11 (ref 5–15)
BUN: 16 mg/dL (ref 6–20)
CALCIUM: 9.2 mg/dL (ref 8.9–10.3)
CO2: 21 mmol/L — ABNORMAL LOW (ref 22–32)
CREATININE: 0.9 mg/dL (ref 0.44–1.00)
Chloride: 102 mmol/L (ref 101–111)
GFR calc Af Amer: >60 mL/min (ref >60)
GLUCOSE: 248 mg/dL — AB (ref 65–99)
Potassium: 3.9 mmol/L (ref 3.5–5.1)
SODIUM: 134 mmol/L — AB (ref 135–145)

## 2017-04-09 MED ORDER — SODIUM CHLORIDE 0.9 % IV BOLUS (SEPSIS)
500.0000 mL | INTRAVENOUS | Status: AC
  Administered 2017-04-09: 500 mL via INTRAVENOUS

## 2017-04-09 MED ORDER — MORPHINE SULFATE (PF) 4 MG/ML IV SOLN
4.0000 mg | Freq: Once | INTRAVENOUS | Status: AC
  Administered 2017-04-10: 4 mg via INTRAVENOUS
  Filled 2017-04-09: qty 1

## 2017-04-09 MED ORDER — MORPHINE SULFATE (PF) 4 MG/ML IV SOLN
4.0000 mg | Freq: Once | INTRAVENOUS | Status: AC
  Administered 2017-04-09: 4 mg via INTRAVENOUS
  Filled 2017-04-09: qty 1

## 2017-04-09 MED ORDER — ONDANSETRON HCL 4 MG/2ML IJ SOLN
4.0000 mg | INTRAMUSCULAR | Status: AC
  Administered 2017-04-09: 4 mg via INTRAVENOUS
  Filled 2017-04-09: qty 2

## 2017-04-09 MED ORDER — LIDOCAINE HCL (CARDIAC) 20 MG/ML IV SOLN
1.5000 mg/kg | INTRAVENOUS | Status: AC
  Administered 2017-04-10: 170.2 mg via INTRAVENOUS
  Filled 2017-04-09: qty 10

## 2017-04-09 NOTE — ED Notes (Signed)
MD made aware that patient wants more pain medicine

## 2017-04-09 NOTE — ED Provider Notes (Signed)
[facility] Emergency Department Provider Note  ____________________________________________   First MD Initiated Contact with Patient 04/09/17 2301     (approximate)  I have reviewed the triage vital signs and the nursing notes.   HISTORY  Chief Complaint Flank Pain    HPI Diamond Sosa is a 61 y.o. female with extensive medical history including history of kidney stones with some complications.  She presents tonight by private vehicle for evaluation of acute onset right leg pain radiating through to her right lower quadrant accompanied with nausea and several episodes of vomiting.  The symptoms started approximately 4 hours prior to her arrival in the emergency department.  She had a similar but milder episode last week that resolved within a few minutes.  Tonight, however, her symptoms are severe and nothing is making them better or worse.  The pain is sharp and aching.  She reports that she has not felt well all day but with nothing specific.  She is concerned that she either has a recurrence of her kidney stones or appendicitis based on the location of the pain.  She denies any other symptoms including fever/chills, chest pain, shortness of breath, and upper abdominal pain.  She has not had an increase of urinary frequency recently and no urinary hesitation but she has had a little bit of burning dysuria over the last week.  She has not seen any blood in her urine.  Past Medical History:  Diagnosis Date  . Adrenal tumor   . Anxiety   . Arthritis    generalized  . Cataracts, bilateral   . Complication of anesthesia 2002   during galbladder surgery, pt was extubated in OR and did not breath, needed to be reintubated, pt remembers this and is concerned this will happen again.  . Diabetes mellitus without complication ([place])   . Dysrhythmia   . Hypertension   . Hypothyroidism    boarderline  . IBS (irritable bowel syndrome)   . Inflammatory bowel disease    . Kidney stone   . Microadenoma ([place])   . Pituitary tumor   . Shortness of breath dyspnea   . Thyroid disease    hypothyroid    Patient Active Problem List   Diagnosis Date Noted  . Kidney stone 11/29/2015  . 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 Type 2 diabetes mellitus ([place]) 02/01/2014  . SINUSITIS- ACUTE-NOS 04/28/2007  . HYPERLIPIDEMIA 03/25/2007  . HYPERTENSION 03/25/2007  . VENOUS INSUFFICIENCY, CHRONIC 03/25/2007  . 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 GLAUCOMA 02/27/2007  . ALLERGIC RHINITIS 02/27/2007  . SPRAIN/STRAIN, KNEE/LEG NEC 12/18/2006    Past Surgical History:  Procedure Laterality Date  . CERVICAL CERCLAGE    . CHOLECYSTECTOMY    . COLONOSCOPY WITH PROPOFOL N/A 09/13/2015   Procedure: COLONOSCOPY WITH PROPOFOL;  Surgeon: Lollie Sails, MD;  Location[facility] ENDOSCOPY;  Service: Endoscopy;  Laterality: N/A;  . NEPHROLITHOTOMY Left 11/29/2015   Procedure: NEPHROLITHOTOMY PERCUTANEOUS WITH URETERAL STENT PLACEMENT /BIOPSIES;  Surgeon: Hollice Espy, MD;  Location: [facility] ORS;  Service: Urology;  Laterality: Left;  . TONSILLECTOMY    . WISDOM TOOTH EXTRACTION Bilateral    all 4    Prior to Admission medications   Medication Sig Start Date End Date Taking? Authorizing Provider  cholecalciferol (VITAMIN D) 1000 UNITS tablet Take 2,000 Units by mouth daily after supper. Reported on 12/12/2015   Yes [provider]  loratadine (CLARITIN) 10 MG tablet Take 10 mg by mouth daily as needed for allergies.  Yes [provider]  metFORMIN (GLUCOPHAGE-XR) 750 MG 24 hr tablet Take 750 mg by mouth 2 (two) times daily.   Yes [provider]  metoprolol succinate (TOPROL-XL) 50 MG 24 hr tablet Take 50 mg by mouth daily. Take with or immediately following a meal at 8pm.   Yes [provider]  ALPRAZolam Duanne Moron) 0.25 MG tablet Reported on 12/12/2015 11/30/15   [provider]  docusate sodium (COLACE) 100 MG capsule Take 1 tablet once or twice  daily as needed for constipation while taking narcotic pain medicine 04/10/17   Hinda Kehr, MD  ondansetron (ZOFRAN ODT) 4 MG disintegrating tablet Allow 1-2 tablets to dissolve in your mouth every 8 hours as needed for nausea/vomiting 04/10/17   Hinda Kehr, MD  oxybutynin (DITROPAN) 5 MG tablet Take 1 tablet (5 mg total) by mouth every 8 (eight) hours as needed for bladder spasms. Patient not taking: Reported on 12/12/2015 11/29/15   Hollice Espy, MD  oxyCODONE-acetaminophen (ROXICET) 5-325 MG tablet Take 1-2 tablets by mouth every 6 (six) hours as needed for severe pain. 04/10/17   Hinda Kehr, MD  tamsulosin (FLOMAX) 0.4 MG CAPS capsule Take 1 tablet by mouth daily until you pass the kidney stone or no longer have symptoms 04/10/17   Hinda Kehr, MD    Allergies Bee venom; Adhesive [tape]; Cefuroxime axetil; and Erythromycin base  Family History  Problem Relation Age of Onset  . Prostate cancer Father   . Urolithiasis Mother   . Kidney disease Neg Hx   . Kidney cancer Neg Hx     Social History Social History  Substance Use Topics  . Smoking status: Former Smoker    Quit date: 07/31/2000  . Smokeless tobacco: Never Used  . Alcohol use No    Review of Systems Constitutional: No fever/chills Eyes: No visual changes. ENT: No sore throat. Cardiovascular: Denies chest pain. Respiratory: Denies shortness of breath. Gastrointestinal: Acute onset right flank pain radiating to right lower quadrant of the abdomen with nausea and vomiting.  No diarrhea/constipation Genitourinary: Intermittent mild burning dysuria.  No increase of urinary frequency Musculoskeletal: Negative for neck pain.  Right flank pain. Integumentary: Negative for rash. Neurological: Negative for headaches, focal weakness or numbness.   ____________________________________________   PHYSICAL EXAM:  VITAL SIGNS: ED Triage Vitals [04/09/17 2250]  Enc Vitals Group     BP (!) 178/74     Pulse Rate 70       Resp 20     Temp 98.5 F (36.9 C)     Temp Source Oral     SpO2 96 %     Weight 113.4 kg (250 lb)     Height 1.6 m (5\' 3" )     Head Circumference      Peak Flow      Pain Score 10     Pain Loc      Pain Edu?      Excl. in [place]?     Constitutional: Alert and oriented. Well appearing and in no acute distress but does appear uncomfortable Eyes: Conjunctivae are normal.  Head: Atraumatic. Cardiovascular: Normal rate, regular rhythm. Good peripheral circulation. Grossly normal heart sounds. Respiratory: Normal respiratory effort.  No retractions. Lungs CTAB. Gastrointestinal: Soft and nontender. No distention.  Musculoskeletal: Right CVA tenderness. No lower extremity tenderness nor edema. No gross deformities of extremities. Neurologic:  Normal speech and language. No gross focal neurologic deficits are appreciated.  Skin:  Skin is warm, dry and intact. No rash noted.  Psychiatric: Mood and affect are normal. Speech and behavior are normal.  ____________________________________________   LABS (all labs ordered are listed, but only abnormal results are displayed)  Labs Reviewed  URINALYSIS, ROUTINE W REFLEX MICROSCOPIC - Abnormal; Notable for the following:       Result Value   Color, Urine YELLOW (*)    APPearance CLEAR (*)    Glucose, UA >=500 (*)    Hgb urine dipstick MODERATE (*)    Leukocytes, UA TRACE (*)    All other components within normal limits  CBC WITH DIFFERENTIAL/PLATELET - Abnormal; Notable for the following:    WBC 13.4 (*)    Neutro Abs 10.4 (*)    All other components within normal limits  BASIC METABOLIC PANEL - Abnormal; Notable for the following:    Sodium 134 (*)    CO2 21 (*)    Glucose, Bld 248 (*)    All other components within normal limits  URINE CULTURE   ____________________________________________  EKG  None - EKG not ordered by ED physician ____________________________________________  RADIOLOGY   Ct Renal Stone Study  Result  Date: 04/09/2017 CLINICAL DATA:  Right flank pain, stone disease suspected. History of renal stones. EXAM: CT ABDOMEN AND PELVIS WITHOUT CONTRAST TECHNIQUE: Multidetector CT imaging of the abdomen and pelvis was performed following the standard protocol without IV contrast. COMPARISON:  04/05/2016 FINDINGS: Lower chest: Minimal scarring at the right lung base. No pleural fluid or consolidation. Hepatobiliary: The liver is enlarged spanning 23 cm cranial caudal with hepatic steatosis. No evidence of focal lesion. Clips in the gallbladder fossa postcholecystectomy. No biliary dilatation. Pancreas: No ductal dilatation or inflammation. Spleen: Normal in size without focal abnormality. Adrenals/Urinary Tract: Stable 3.3 cm left adrenal adenoma. Right adrenal gland is normal. Obstructing 4 mm stone in the right mid proximal ureter (at the level of L4) with moderate proximal hydroureteronephrosis and perinephric edema. Ureter distal to this is decompressed. No additional nonobstructing calculi in either kidney. No left hydronephrosis. Urinary bladder is minimally distended without wall thickening or stone. Stomach/Bowel: Tiny hiatal hernia. Stomach is nondistended. No bowel obstruction, inflammation or wall thickening. Normal appendix. Vascular/Lymphatic: Moderate aortic atherosclerosis. No aneurysm. Small reactive appearing periportal nodes. Unchanged mesenteric edema in the left mid abdomen with small mesenteric nodes, stable from prior exams. No new adenopathy. Reproductive: Uterus and bilateral adnexa are unremarkable. Other: No free air, free fluid, or intra-abdominal fluid collection. Musculoskeletal: There are no acute or suspicious osseous abnormalities. IMPRESSION: 1. Obstructing 4 mm stone in the right mid proximal ureter with moderate hydronephrosis and perinephric edema. 2. Chronic hepatomegaly and steatosis. 3. Chronic mesenteric panniculitis, unchanged. 4.  Aortic Atherosclerosis (ICD10-I70.0).  Electronically Signed   By: Jeb Levering M.D.   On: 04/09/2017 23:47    ____________________________________________   PROCEDURES  Critical Care performed: No   Procedure(s) performed:   Procedures   ____________________________________________   INITIAL IMPRESSION / ASSESSMENT AND PLAN / ED COURSE  As part of my medical decision making, I reviewed the following data within the [place] notes reviewed and incorporated, Labs reviewed , Old chart reviewed and [place] Controlled Substance Database    Differential diagnosis includes, but is not limited to, ovarian cyst, ovarian torsion, acute appendicitis, diverticulitis, urinary tract infection/pyelonephritis, endometriosis, bowel obstruction, colitis, renal colic, gastroenteritis, hernia, etc. however, I reviewed her medical record and I see numerous notes from urology at [place] and she is also seen Dr. Erlene Quan with the local urological group.  The  patient clarified that she was diagnosed with multiple kidney stones and had surgery to remove them last year by Dr. Erlene Quan, and Dr. Erlene Quan told her at the time that she had cancer.  She went to [place] for a second opinion and they told her she did not have cancer.  They attempted to do a urogram but she could not be intubated safely so the procedure was canceled.  She had an outpatient CT scan that reportedly did not show anything and she has not followed up with him because she states that they have not contacted her since that time, approximately 13 months ago.  She has been asymptomatic since that time.  I will order a CT renal stone protocol, basic labs including urinalysis and urine culture as she has been noted to have  pyuria in the past, and morphine and Zofran and a small fluid bolus.  I will reassess after the results.  She agrees with the plan.  Clinical Course as of Apr 10 242  Wed Apr 10, 2017  0130 Obstructing 4 mm stone  in the mid right proximal ureter.  Awaiting results of urinalysis.  Patient's pain is much better controlled at this time, she is no longer anxiously moving around in the bed and reports that her pain is now down to moderate.  I will treat with Toradol and 2 Norco and await the results of her urinalysis to determine if empiric antibiotics are necessary.  Although her stone is obstructive it should pass on its own and given that her symptoms are well controlled I do not believe that she needs admission or urology consult.  I discussed this with her and she agrees that she  does not want a stent unless it is absolutely necessary. CT Renal Stone Study [CF]  5400 No sign of urinary tract infection.  I will not give empiric antibiotics at this time.  A urine culture is pending.  The patient's pain is very well controlled after IV lidocaine, Toradol, and I gave 2 Percocet so that she would have something to be working during the night.  He is comfortable following up as an outpatient.  She is hypertensive but was in severe pain when she first arrived and her blood pressure came down substantially after her pain improved.  I gave my usual and customary return precautions.  [CF]  8676 I reviewed the patient's prescription history over the last 24 months in the multi-state controlled substances database(s) that includes [place], [place], [place], [place], [place], [place], [place], [place], [place], [place], [place], [place], [place], and [place].  Results were notable for 2 prescriptions for narcotics prescribed more than a year ago when she was having her prior issues with kidney stones.   [CF]    Clinical Course User Index [CF] Hinda Kehr, MD    ____________________________________________  FINAL CLINICAL IMPRESSION(S) / ED DIAGNOSES  Final diagnoses:  Ureteral colic  Kidney stone     MEDICATIONS GIVEN DURING THIS VISIT:  Medications  morphine 4 MG/ML  injection 4 mg (4 mg Intravenous Given 04/09/17 2315)  ondansetron (ZOFRAN) injection 4 mg (4 mg Intravenous Given 04/09/17 2315)  sodium chloride 0.9 % bolus 500 mL (0 mLs Intravenous Stopped 04/10/17 0100)  lidocaine (cardiac) 100 mg/56ml (XYLOCAINE) 20 MG/ML injection 2% 170.2 mg (170.2 mg Intravenous Given 04/10/17 0004)  morphine 4 MG/ML injection 4 mg (4 mg Intravenous Given 04/10/17 0014)  ondansetron (ZOFRAN) injection 4 mg (4 mg Intravenous Given 04/10/17 0014)  ketorolac (TORADOL) 30 MG/ML injection 15 mg (15 mg Intravenous Given 04/10/17 0131)  HYDROcodone-acetaminophen (NORCO/VICODIN) 5-325 MG per tablet 2 tablet (2 tablets Oral Given 04/10/17 0131)     NEW OUTPATIENT MEDICATIONS STARTED DURING THIS VISIT:  New Prescriptions   DOCUSATE SODIUM (COLACE) 100 MG CAPSULE    Take 1 tablet once or twice daily as needed for constipation while taking narcotic pain medicine   ONDANSETRON (ZOFRAN ODT) 4 MG DISINTEGRATING TABLET    Allow 1-2 tablets to dissolve in your mouth every 8 hours as needed for nausea/vomiting   OXYCODONE-ACETAMINOPHEN (ROXICET) 5-325 MG TABLET    Take 1-2 tablets by mouth every 6 (six) hours as needed for severe pain.   TAMSULOSIN (FLOMAX) 0.4 MG CAPS CAPSULE    Take 1 tablet by mouth daily until you pass the kidney stone or no longer have symptoms    Modified Medications   No medications on file    Discontinued Medications   HYDROCODONE-ACETAMINOPHEN (NORCO/VICODIN) 5-325 MG TABLET    Take 1-2 tablets by mouth every 6 (six) hours as needed for moderate pain.   TAMSULOSIN (FLOMAX) 0.4 MG CAPS CAPSULE    Take 1 capsule (0.4 mg total) by mouth daily.     Note:  This document was prepared using Dragon voice recognition software and may include unintentional dictation errors.    Hinda Kehr, MD 04/10/17 [phone]

## 2017-04-09 NOTE — ED Triage Notes (Signed)
Patient to ER for c/o severe pain to RLQ and right flank. Patient reports h/o kidney stones that feels similar. Patient states she developed pain at approx 2000, but "hasn't felt well all day". Patient tearful in triage.

## 2017-04-10 LAB — URINALYSIS, ROUTINE W REFLEX MICROSCOPIC
BACTERIA UA: NONE SEEN
BILIRUBIN URINE: NEGATIVE
Glucose, UA: >=500 mg/dL — AB
Ketones, ur: NEGATIVE mg/dL
NITRITE: NEGATIVE
Protein, ur: NEGATIVE mg/dL
Specific Gravity, Urine: 1.011 (ref 1.005–1.030)
Squamous Epithelial / LPF: NONE SEEN
pH: 5 (ref 5.0–8.0)

## 2017-04-10 MED ORDER — OXYCODONE-ACETAMINOPHEN 5-325 MG PO TABS: 1.0000 | ORAL_TABLET | Freq: Four times a day (QID) | ORAL | 0 refills | Status: DC | PRN

## 2017-04-10 MED ORDER — ONDANSETRON 4 MG PO TBDP: ORAL_TABLET | ORAL | 0 refills | Status: DC

## 2017-04-10 MED ORDER — TAMSULOSIN HCL 0.4 MG PO CAPS: ORAL_CAPSULE | ORAL | 0 refills | Status: DC

## 2017-04-10 MED ORDER — DOCUSATE SODIUM 100 MG PO CAPS: ORAL_CAPSULE | ORAL | 0 refills | Status: DC

## 2017-04-10 MED ORDER — HYDROCODONE-ACETAMINOPHEN 5-325 MG PO TABS
2.0000 | ORAL_TABLET | Freq: Once | ORAL | Status: AC
  Administered 2017-04-10: 2 via ORAL
  Filled 2017-04-10: qty 2

## 2017-04-10 MED ORDER — ONDANSETRON 4 MG PO TBDP
ORAL_TABLET | ORAL | Status: AC
  Filled 2017-04-10: qty 1

## 2017-04-10 MED ORDER — ONDANSETRON HCL 4 MG/2ML IJ SOLN
INTRAMUSCULAR | Status: AC
  Filled 2017-04-10: qty 2

## 2017-04-10 MED ORDER — KETOROLAC TROMETHAMINE 30 MG/ML IJ SOLN
15.0000 mg | Freq: Once | INTRAMUSCULAR | Status: AC
  Administered 2017-04-10: 15 mg via INTRAVENOUS
  Filled 2017-04-10: qty 1

## 2017-04-10 MED ORDER — ONDANSETRON 4 MG PO TBDP
4.0000 mg | ORAL_TABLET | Freq: Once | ORAL | Status: AC
  Administered 2017-04-10: 4 mg via ORAL

## 2017-04-10 MED ORDER — ONDANSETRON HCL 4 MG/2ML IJ SOLN
4.0000 mg | Freq: Once | INTRAMUSCULAR | Status: AC
  Administered 2017-04-10: 4 mg via INTRAVENOUS

## 2017-04-10 NOTE — Discharge Instructions (Signed)
You have been seen in the Emergency Department (ED) today for pain that we believe based on your workup, is caused by kidney stones.  As we have discussed, please drink plenty of fluids.  Please make a follow up appointment with the physician(s) listed elsewhere in this documentation. ° °You may take pain medication as needed but ONLY as prescribed.  Please also take your prescribed Flomax daily.  We also recommend that you take over-the-counter ibuprofen regularly according to label instructions over the next 5 days.  Take it with meals to minimize stomach discomfort. ° °Please see your doctor as soon as possible as stones may take 1-3 weeks to pass and you may require additional care or medications. ° °Do not drink alcohol, drive or participate in any other potentially dangerous activities while taking opiate pain medication as it may make you sleepy. Do not take this medication with any other sedating medications, either prescription or over-the-counter. If you were prescribed Percocet or Vicodin, do not take these with acetaminophen (Tylenol) as it is already contained within these medications. °  °Take Percocet as needed for severe pain.  This medication is an opiate (or narcotic) pain medication and can be habit forming.  Use it as little as possible to achieve adequate pain control.  Do not use or use it with extreme caution if you have a history of opiate abuse or dependence.  If you are on a pain contract with your primary care doctor or a pain specialist, be sure to let them know you were prescribed this medication today from the  [facility].  This medication is intended for your use only - do not give any to anyone else and keep it in a secure place where nobody else, especially children, have access to it.  It will also cause or worsen constipation, so you may want to consider taking an over-the-counter stool softener while you are taking this medication. ° °Return to the  Emergency Department (ED) or call your doctor if you have any worsening pain, fever, painful urination, are unable to urinate, or develop other symptoms that concern you. ° °

## 2017-04-11 LAB — URINE CULTURE
Culture: NO GROWTH
SPECIAL REQUESTS: NORMAL

## 2017-04-17 ENCOUNTER — Ambulatory Visit
Admission: RE | Admit: 2017-04-17 | Discharge: 2017-04-17 | Disposition: A | Discharge status: Home / Self Care | Payer: BLUE CROSS/BLUE SHIELD | Source: Ambulatory Visit | Attending: Urology | Admitting: Urology

## 2017-04-17 ENCOUNTER — Encounter: Payer: Self-pay | Admitting: Urology

## 2017-04-17 ENCOUNTER — Ambulatory Visit (INDEPENDENT_AMBULATORY_CARE_PROVIDER_SITE_OTHER): Payer: BLUE CROSS/BLUE SHIELD | Admitting: Urology

## 2017-04-17 VITALS — BP 160/73 | HR 91 | Ht 63.0 in | Wt 257.1 lb

## 2017-04-17 DIAGNOSIS — N201 Calculus of ureter: Secondary | ICD-10-CM | POA: Diagnosis not present

## 2017-04-17 LAB — URINALYSIS, COMPLETE
Bilirubin, UA: NEGATIVE
GLUCOSE, UA: NEGATIVE
KETONES UA: NEGATIVE
NITRITE UA: NEGATIVE
PROTEIN UA: NEGATIVE
RBC, UA: NEGATIVE
Urobilinogen, Ur: 0.2 mg/dL (ref 0.2–1.0)
pH, UA: 5.5 (ref 5.0–7.5)

## 2017-04-17 LAB — MICROSCOPIC EXAMINATION
Bacteria, UA: NONE SEEN
Epithelial Cells (non renal): NONE SEEN /hpf (ref 0–10)
RBC, UA: NONE SEEN /hpf (ref 0–?)

## 2017-04-17 LAB — BLADDER SCAN AMB NON-IMAGING: Scan Result: 138

## 2017-04-17 NOTE — Progress Notes (Signed)
04/17/2017 1:36 PM   Diamond Sosa May 02, 1956 [id number]  Referring provider: Juluis Pitch, MD [place] [place], [place] [address]  Chief Complaint  Patient presents with  . Nephrolithiasis    HPI: 61 y.o female presents for evaluation of right renal colic.  She was seen in the ED on 04/09/2017 with acute onset of right lower quadrant pain radiating to the right lower back.  The pain is described as severe without identifiable precipitating, aggravating or alleviating factors.  She had nausea and vomiting but denied fever or chills.  A CT of the abdomen pelvis without contrast was remarkable for 4 mm right proximal ureteral calculus.  After her pain was controlled with parenteral analgesics she has had no further pain.  Today she began to have increasing urinary frequency, urgency and voiding small amounts.  She had a left PCNL last year and had a lesion that interpreted as urothelial carcinoma.  She was seen at [facility] and a second read on the pathology was felt to represent inflammation.  She was scheduled for a second look ureteroscopy however she apparently could not be intubated and the procedure was aborted.  She had a follow-up CT urogram in November 2017 which showed no filling defects.   PMH: Past Medical History:  Diagnosis Date  . Adrenal tumor   . Anxiety   . Arthritis    generalized  . Cataracts, bilateral   . Complication of anesthesia 2002   during galbladder surgery, pt was extubated in OR and did not breath, needed to be reintubated, pt remembers this and is concerned this will happen again.  . Diabetes mellitus without complication ([place])   . Dysrhythmia   . Hypertension   . Hypothyroidism    boarderline  . IBS (irritable bowel syndrome)   . Inflammatory bowel disease   . Kidney stone   . Microadenoma ([place])   . Pituitary tumor   . Shortness of breath dyspnea   . Thyroid disease    hypothyroid    Surgical History: Past Surgical History:    Procedure Laterality Date  . CERVICAL CERCLAGE    . CHOLECYSTECTOMY    . TONSILLECTOMY    . WISDOM TOOTH EXTRACTION Bilateral    all 4    Home Medications:  Allergies as of 04/17/2017      Reactions   Bee Venom Shortness Of Breath   Adhesive [tape] Other (See Comments)   Whelp on skin.   Cefuroxime Axetil    REACTION: GI distress   Erythromycin Base Nausea Only   Extreme nausea      Medication List        Accurate as of 04/17/17  1:36 PM. Always use your most recent med list.          cholecalciferol 1000 units tablet Commonly known as:  VITAMIN D Take 2,000 Units by mouth daily after supper. Reported on 12/12/2015   loratadine 10 MG tablet Commonly known as:  CLARITIN Take 10 mg by mouth daily as needed for allergies.   metFORMIN 750 MG 24 hr tablet Commonly known as:  GLUCOPHAGE-XR Take 750 mg by mouth 2 (two) times daily.   metoprolol succinate 50 MG 24 hr tablet Commonly known as:  TOPROL-XL Take 50 mg by mouth daily. Take with or immediately following a meal at 8pm.   MULTI-VITAMINS Tabs Take by mouth.   tamsulosin 0.4 MG Caps capsule Commonly known as:  FLOMAX Take 1 tablet by mouth daily until you pass the  kidney stone or no longer have symptoms       Allergies:  Allergies  Allergen Reactions  . Bee Venom Shortness Of Breath  . Adhesive [Tape] Other (See Comments)    Whelp on skin.  . Cefuroxime Axetil     REACTION: GI distress  . Erythromycin Base Nausea Only    Extreme nausea    Family History: Family History  Problem Relation Age of Onset  . Prostate cancer Father   . Urolithiasis Mother   . Kidney disease Neg Hx   . Kidney cancer Neg Hx     Social History:  reports that she quit smoking about 16 years ago. she has never used smokeless tobacco. She reports that she does not drink alcohol or use drugs.  ROS: UROLOGY Frequent Urination?: Yes Hard to postpone urination?: No Burning/pain with urination?: No Get up at night to  urinate?: Yes Leakage of urine?: No Urine stream starts and stops?: No Trouble starting stream?: Yes Do you have to strain to urinate?: No Blood in urine?: No Urinary tract infection?: No Sexually transmitted disease?: No Injury to kidneys or bladder?: No Painful intercourse?: No Weak stream?: Yes Currently pregnant?: No Vaginal bleeding?: No Last menstrual period?: n  Gastrointestinal Nausea?: Yes Vomiting?: No Indigestion/heartburn?: No Diarrhea?: Yes Constipation?: No  Constitutional Fever: No Night sweats?: No Weight loss?: No Fatigue?: Yes  Skin Skin rash/lesions?: No Itching?: No  Eyes Blurred vision?: Yes Double vision?: Yes  Ears/Nose/Throat Sore throat?: No Sinus problems?: No  Hematologic/Lymphatic Swollen glands?: No Easy bruising?: No  Cardiovascular Leg swelling?: Yes Chest pain?: No  Respiratory Cough?: No Shortness of breath?: No  Endocrine Excessive thirst?: No  Musculoskeletal Back pain?: No Joint pain?: Yes  Neurological Headaches?: No Dizziness?: No  Psychologic Depression?: No Anxiety?: No  Physical Exam: BP (!) 160/73 (BP Location: Right Arm, Patient Position: Sitting, Cuff Size: Large)   Pulse 91   Ht 5\' 3"  (1.6 m)   Wt 257 lb 1.6 oz (116.6 kg)   BMI 45.54 kg/m   Constitutional:  Alert and oriented, No acute distress. HEENT: [place] AT, moist mucus membranes.  Trachea midline, no masses. Cardiovascular: No clubbing, cyanosis, or edema. Respiratory: Normal respiratory effort, no increased work of breathing. GI: Abdomen is soft, nontender, nondistended, no abdominal masses GU: No CVA tenderness. Skin: No rashes, bruises or suspicious lesions. Lymph: No cervical or inguinal adenopathy. Neurologic: Grossly intact, no focal deficits, moving all 4 extremities. Psychiatric: Normal mood and affect.  Laboratory Data: Lab Results  Component Value Date   WBC 13.4 (H) 04/09/2017   HGB 13.0 04/09/2017   HCT 39.5 04/09/2017     MCV 88.3 04/09/2017   PLT 318 04/09/2017    Lab Results  Component Value Date   CREATININE 0.90 04/09/2017    Urinalysis Lab Results  Component Value Date   SPECGRAV 1.020 10/12/2015   PHUR 5.0 10/12/2015   COLORU Yellow 10/12/2015   APPEARANCEUR CLEAR (A) 04/10/2017   LEUKOCYTESUR TRACE (A) 04/10/2017   PROTEINUR NEGATIVE 04/10/2017   GLUCOSEU >=500 (A) 04/10/2017   KETONESU Negative 10/12/2015   RBCU TOO NUMEROUS TO COUNT 04/10/2017   BILIRUBINUR NEGATIVE 04/10/2017   UUROB 0.2 10/12/2015   NITRITE NEGATIVE 04/10/2017    Lab Results  Component Value Date   LABMICR See below: 10/12/2015   WBCUA 11-30 (A) 10/12/2015   RBCUA >30 (A) 10/12/2015   LABEPIT 0-10 10/12/2015   MUCUS Present (A) 10/12/2015   BACTERIA NONE SEEN 04/10/2017    Pertinent Imaging:  Results for orders placed during the hospital encounter of 11/29/15  DG Abd 1 View   Narrative CLINICAL DATA:  Left nephrolithiasis  EXAM: DG C-ARM 61-120 MIN; ABDOMEN - 1 VIEW  COMPARISON:  11/29/2015  FINDINGS: Multiple low intraoperative spot images demonstrate placement of left nephrostomy catheter and a left ureteral stent.  IMPRESSION: Percutaneous access to the left renal collecting system and placement of left ureteral stent.   Electronically Signed   By: Rolm Baptise M.D.   On: 11/29/2015 12:27     Results for orders placed during the hospital encounter of 07/08/15  US Renal   Narrative CLINICAL DATA:  Known distal right ureteral stone and left renal calculi  EXAM: RENAL / URINARY TRACT ULTRASOUND COMPLETE  COMPARISON:  06/17/2015  FINDINGS: Right Kidney:  Length: 14 cm. The degree of hydronephrosis when compared with the prior CT has improved significantly. A minimal amount of dilatation is seen.  Left Kidney:  Length: 14.3 cm. Very mild hydronephrosis is noted. Scattered echogenic foci are noted consistent with renal calculi. These are similar to that seen on the prior  exam.  Bladder:  Appears normal for degree of bladder distention.  IMPRESSION: Left renal calculi with minimal hydronephrotic change.  Improvement in the degree of right-sided hydronephrosis.   Electronically Signed   By: Inez Catalina M.D.   On: 07/08/2015 14:36     Results for orders placed during the hospital encounter of 04/09/17  CT Renal Stone Study   Narrative CLINICAL DATA:  Right flank pain, stone disease suspected. History of renal stones.  EXAM: CT ABDOMEN AND PELVIS WITHOUT CONTRAST  TECHNIQUE: Multidetector CT imaging of the abdomen and pelvis was performed following the standard protocol without IV contrast.  COMPARISON:  04/05/2016  FINDINGS: Lower chest: Minimal scarring at the right lung base. No pleural fluid or consolidation.  Hepatobiliary: The liver is enlarged spanning 23 cm cranial caudal with hepatic steatosis. No evidence of focal lesion. Clips in the gallbladder fossa postcholecystectomy. No biliary dilatation.  Pancreas: No ductal dilatation or inflammation.  Spleen: Normal in size without focal abnormality.  Adrenals/Urinary Tract: Stable 3.3 cm left adrenal adenoma. Right adrenal gland is normal.  Obstructing 4 mm stone in the right mid proximal ureter (at the level of L4) with moderate proximal hydroureteronephrosis and perinephric edema. Ureter distal to this is decompressed. No additional nonobstructing calculi in either kidney. No left hydronephrosis. Urinary bladder is minimally distended without wall thickening or stone.  Stomach/Bowel: Tiny hiatal hernia. Stomach is nondistended. No bowel obstruction, inflammation or wall thickening. Normal appendix.  Vascular/Lymphatic: Moderate aortic atherosclerosis. No aneurysm. Small reactive appearing periportal nodes. Unchanged mesenteric edema in the left mid abdomen with small mesenteric nodes, stable from prior exams. No new adenopathy.  Reproductive: Uterus and bilateral  adnexa are unremarkable.  Other: No free air, free fluid, or intra-abdominal fluid collection.  Musculoskeletal: There are no acute or suspicious osseous abnormalities.  IMPRESSION: 1. Obstructing 4 mm stone in the right mid proximal ureter with moderate hydronephrosis and perinephric edema. 2. Chronic hepatomegaly and steatosis. 3. Chronic mesenteric panniculitis, unchanged. 4.  Aortic Atherosclerosis (ICD10-I70.0).   Electronically Signed   By: Jeb Levering M.D.   On: 04/09/2017 23:47     Assessment & Plan:    1.  Right ureteral calculus Most likely progression of her stone into the distal ureter.  A KUB is ordered and she will be notified with results.  - Urinalysis, Complete - Bladder Scan (Post Void Residual) in office  Abbie Sons, [place] [address] [place], [place] [address] [phone]

## 2017-04-18 ENCOUNTER — Telehealth: Payer: Self-pay | Admitting: Urology

## 2017-04-18 NOTE — Telephone Encounter (Signed)
Patient called the office today to obtain x-ray results.  She may be reached at [phone].

## 2017-04-18 NOTE — Telephone Encounter (Signed)
Result note sent to clinical 11/8

## 2017-04-19 ENCOUNTER — Telehealth: Payer: Self-pay

## 2017-04-19 DIAGNOSIS — N2889 Other specified disorders of kidney and ureter: Secondary | ICD-10-CM

## 2017-04-19 DIAGNOSIS — N281 Cyst of kidney, acquired: Secondary | ICD-10-CM

## 2017-04-19 NOTE — Telephone Encounter (Signed)
-----   Message from Abbie Sons, MD sent at 04/18/2017 12:08 PM EST ----- KUB reviewed.  There are faint calcifications in the right pelvis which may represent her stone however it is difficult to tell for sure.  If symptoms are manageable can continue a trial of passage.  If increasing can go ahead and schedule her follow-up CT.

## 2017-04-19 NOTE — Telephone Encounter (Signed)
LMOM

## 2017-04-22 NOTE — Telephone Encounter (Addendum)
Pt returning call from message left on Friday. Pt asking for results and follow up plan. Please advise. Thanks. Pt asks that you call her work # [phone] ext [phone]

## 2017-04-22 NOTE — Telephone Encounter (Signed)
Tried to call pt at work number and was not able to leave a message.

## 2017-04-23 ENCOUNTER — Encounter: Payer: Self-pay | Admitting: Urology

## 2017-04-23 NOTE — Telephone Encounter (Signed)
Spoke with pt in reference to KUB results. Pt stated that she currently is not have symptoms and would rather hold off on the CT as of now. Pt stated that last year cancer of the kidney was mentioned. Pt stated that she was under the impression she would need another scan(pt was unsure what kind) after stone symptoms resolved. Please advise.

## 2017-04-25 ENCOUNTER — Ambulatory Visit: Payer: BLUE CROSS/BLUE SHIELD | Admitting: Urology

## 2017-04-26 NOTE — Telephone Encounter (Signed)
The follow-up imaging would be a CT of the abdomen with and without contrast

## 2017-04-30 NOTE — Telephone Encounter (Signed)
LMOM

## 2017-05-01 NOTE — Addendum Note (Signed)
Addended by: Abbie Sons on: 05/01/2017 03:50 PM   Modules accepted: Orders

## 2017-05-01 NOTE — Telephone Encounter (Signed)
Patient returned call.  She is interested in having the CT scan.  Please place the order.

## 2017-05-01 NOTE — Telephone Encounter (Signed)
Orders placed.

## 2017-05-01 NOTE — Telephone Encounter (Signed)
Chart reviewed-it should actually be a hematuria CT scan.  Order was updated.

## 2017-05-15 ENCOUNTER — Ambulatory Visit
Admission: RE | Admit: 2017-05-15 | Discharge: 2017-05-15 | Disposition: A | Discharge status: Home / Self Care | Payer: BLUE CROSS/BLUE SHIELD | Source: Ambulatory Visit | Attending: Urology | Admitting: Urology

## 2017-05-15 DIAGNOSIS — N200 Calculus of kidney: Secondary | ICD-10-CM | POA: Diagnosis not present

## 2017-05-15 DIAGNOSIS — I700 Atherosclerosis of aorta: Secondary | ICD-10-CM | POA: Insufficient documentation

## 2017-05-15 DIAGNOSIS — N2889 Other specified disorders of kidney and ureter: Secondary | ICD-10-CM | POA: Diagnosis present

## 2017-05-15 MED ORDER — IOPAMIDOL (ISOVUE-300) INJECTION 61%
125.0000 mL | Freq: Once | INTRAVENOUS | Status: AC | PRN
  Administered 2017-05-15: 125 mL via INTRAVENOUS

## 2017-05-22 ENCOUNTER — Telehealth: Payer: Self-pay

## 2017-05-22 NOTE — Telephone Encounter (Signed)
-----   Message from Abbie Sons, MD sent at 05/17/2017  9:08 AM EST ----- CT showed no significant abnormalities.  No masses were present.  Her 4 mm right ureteral calculus is no longer present.

## 2017-05-22 NOTE — Telephone Encounter (Signed)
LMOM

## 2017-05-27 NOTE — Telephone Encounter (Signed)
Will send a letter

## 2017-06-29 IMAGING — CR DG C-ARM 61-120 MIN
5 series · 5 of 5 positions shown · non-contrast
Comparison: 11/29/2015

CLINICAL DATA: Left nephrolithiasis

EXAM:
DG C-ARM 61-120 MIN; ABDOMEN - 1 VIEW

[pulsed (1 of 5)]
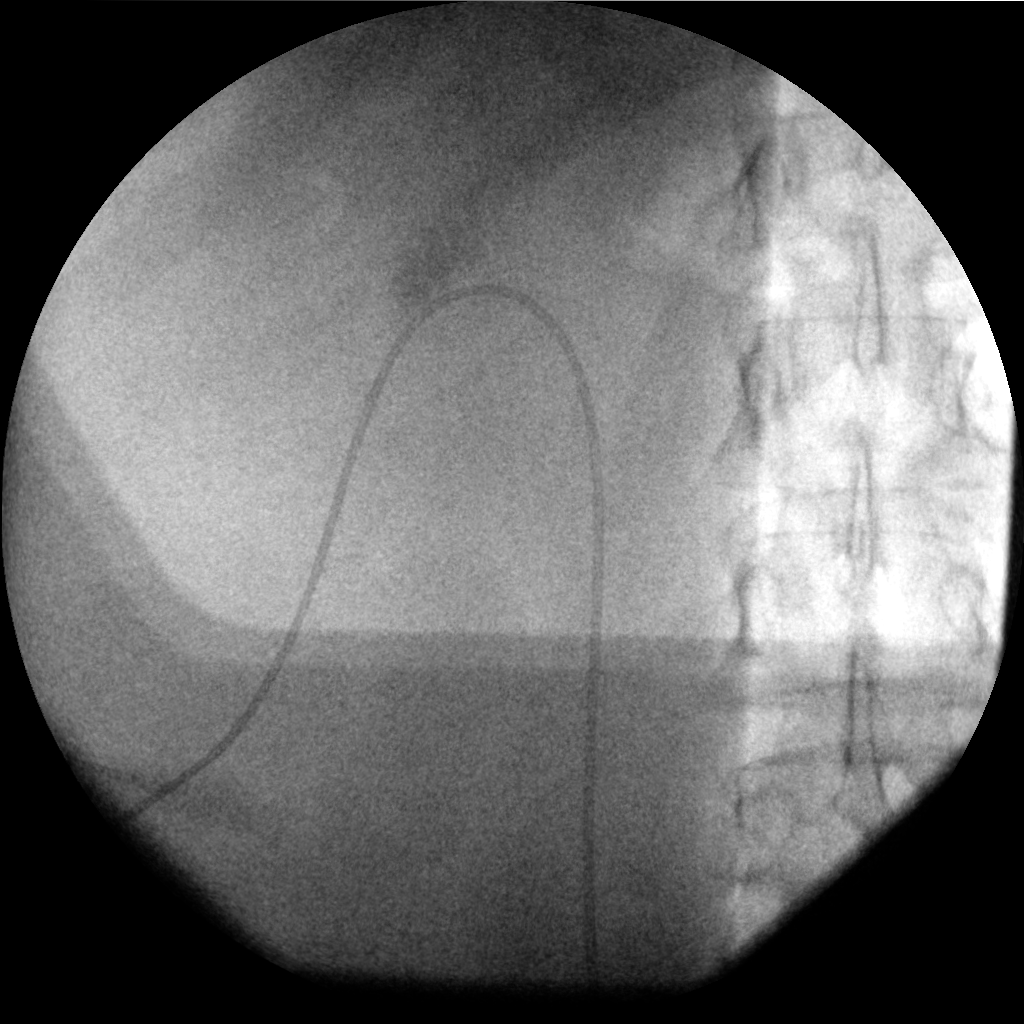

[pulsed (2 of 5)]
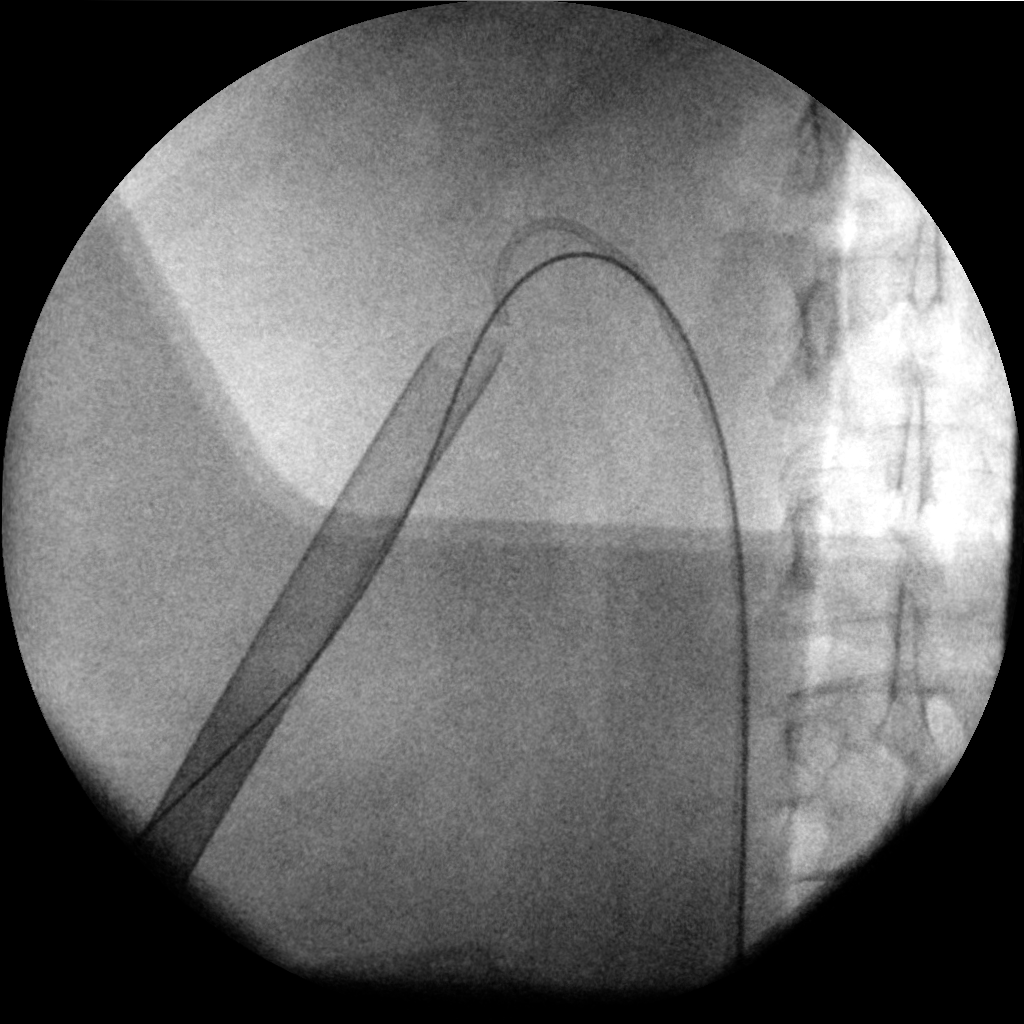

[pulsed (3 of 5)]
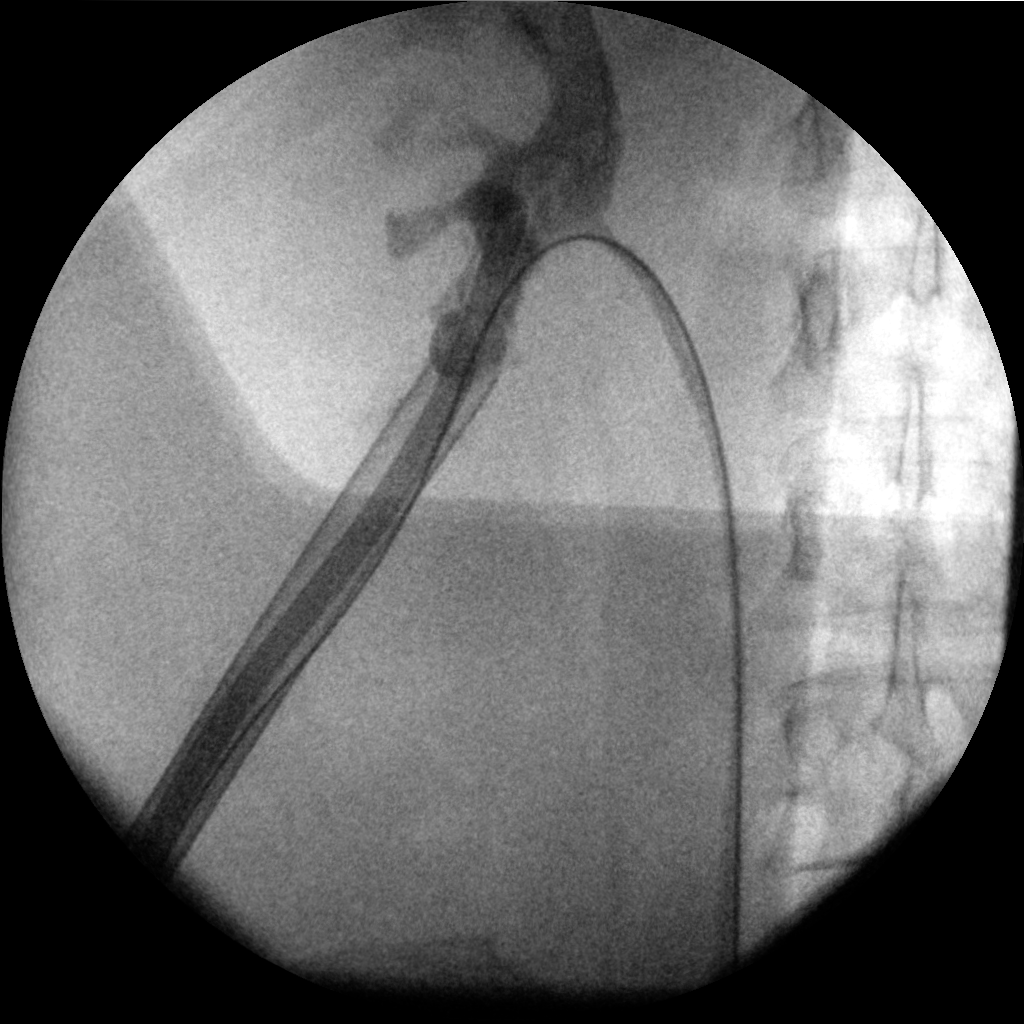

[pulsed (4 of 5)]
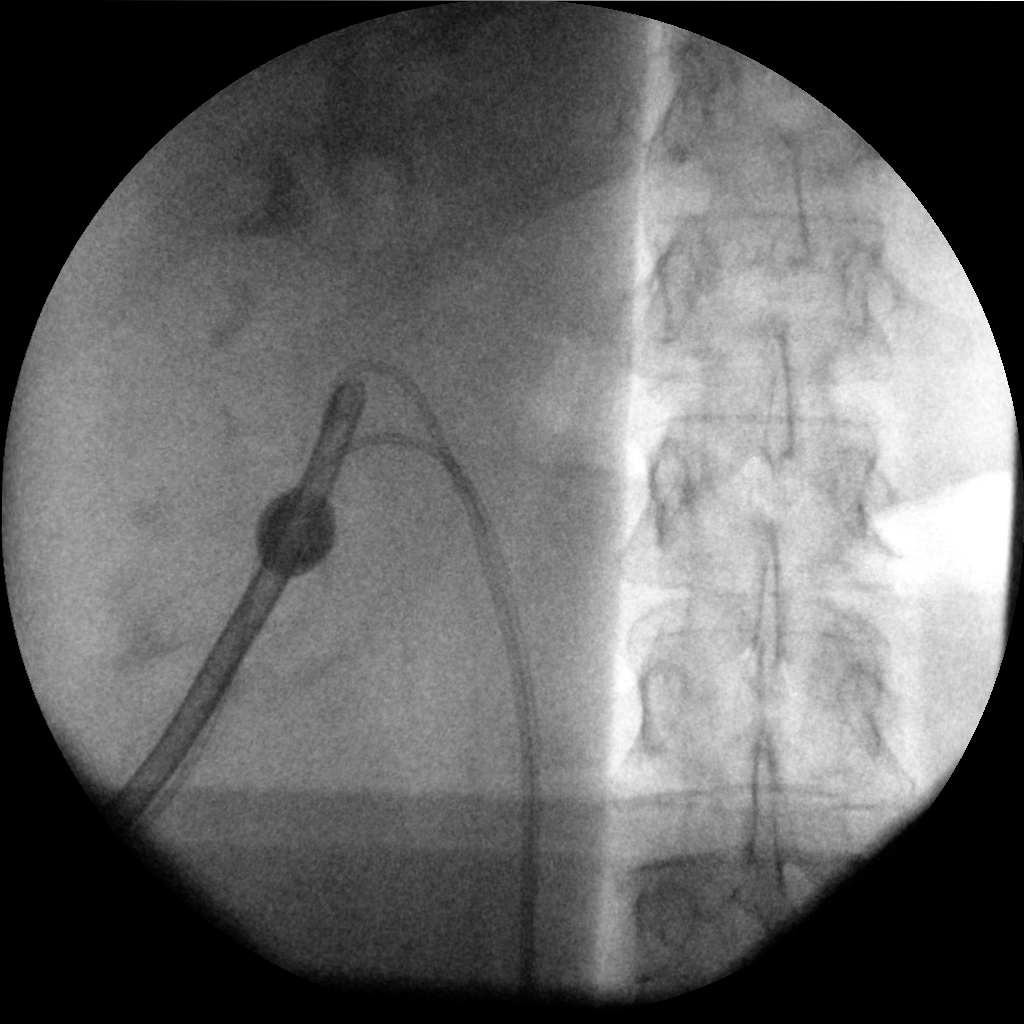

[pulsed (5 of 5)]
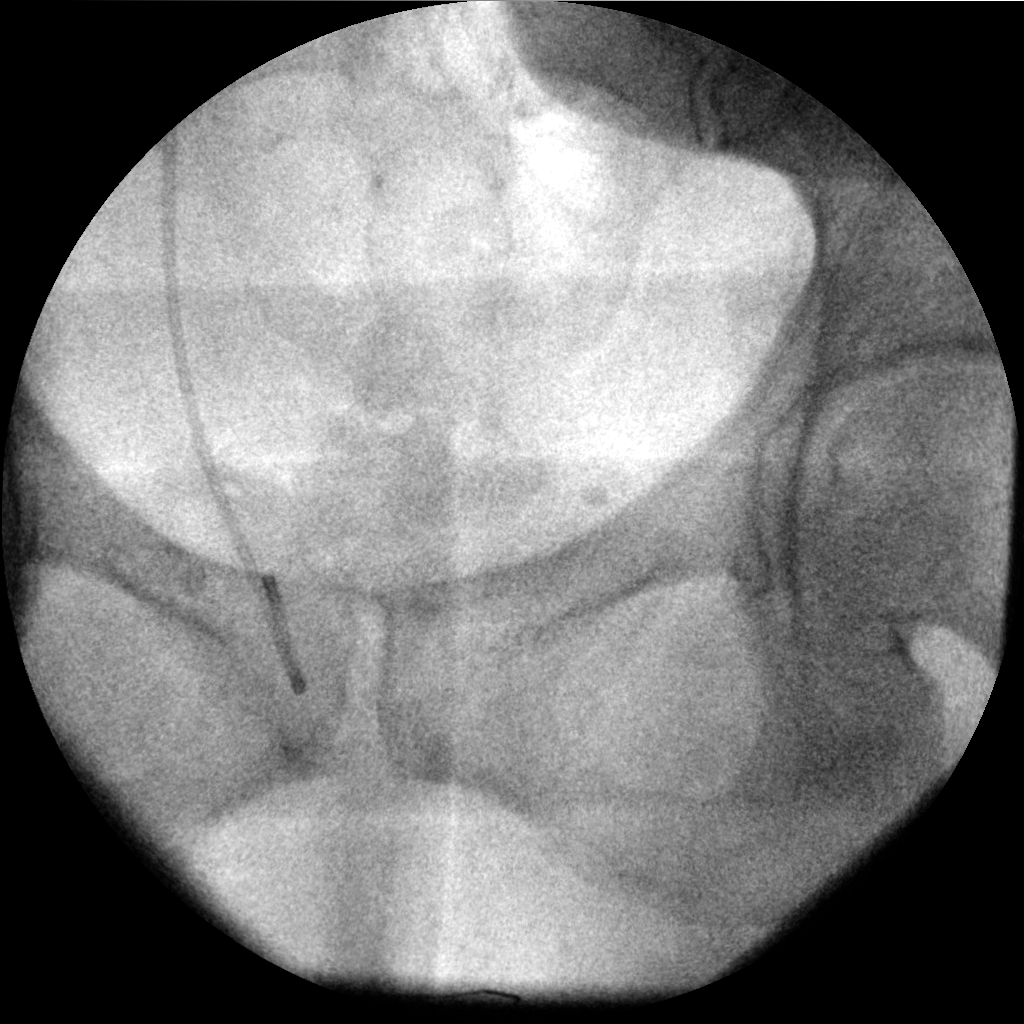

[5 of 5 positions shown; findings below may reference images not displayed]

FINDINGS: Multiple low intraoperative spot images demonstrate placement of
left nephrostomy catheter and a left ureteral stent.
IMPRESSION: Percutaneous access to the left renal collecting system and
placement of left ureteral stent.

## 2017-06-29 IMAGING — XA IR NEPHROSTOMY PLACEMENT LEFT
6 series · 7 of 7 positions shown · non-contrast
Comparison: None.

INDICATION: Left nephrolithiasis

EXAM:
IR NEPHROSTOMY PLACEMENT LEFT

[Series 1: single · 1 of 1 slices shown (1 of 5)]
[im 1/1]
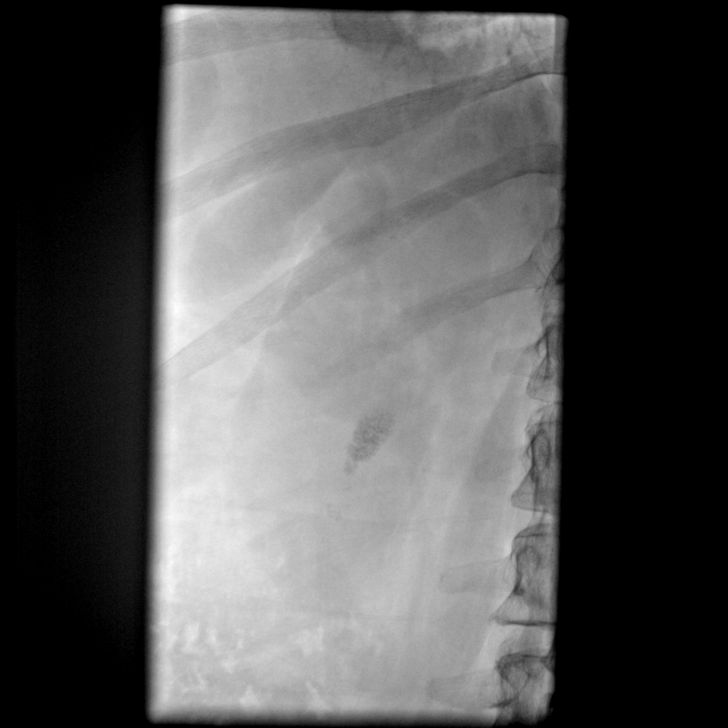

[Series 2: single · 1 of 1 slices shown (2 of 5)]
[im 1/1]
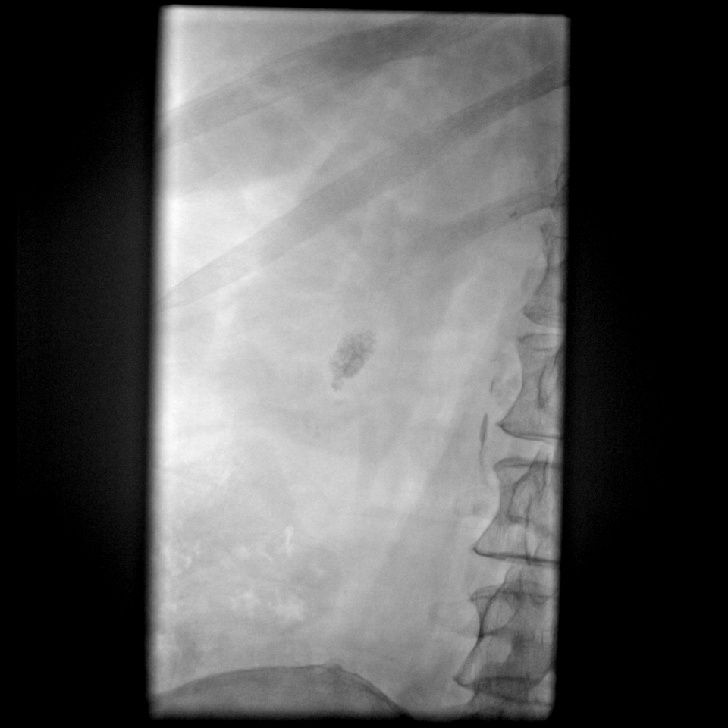

[Series 3: single · 1 of 1 slices shown (3 of 5)]
[im 1/1]
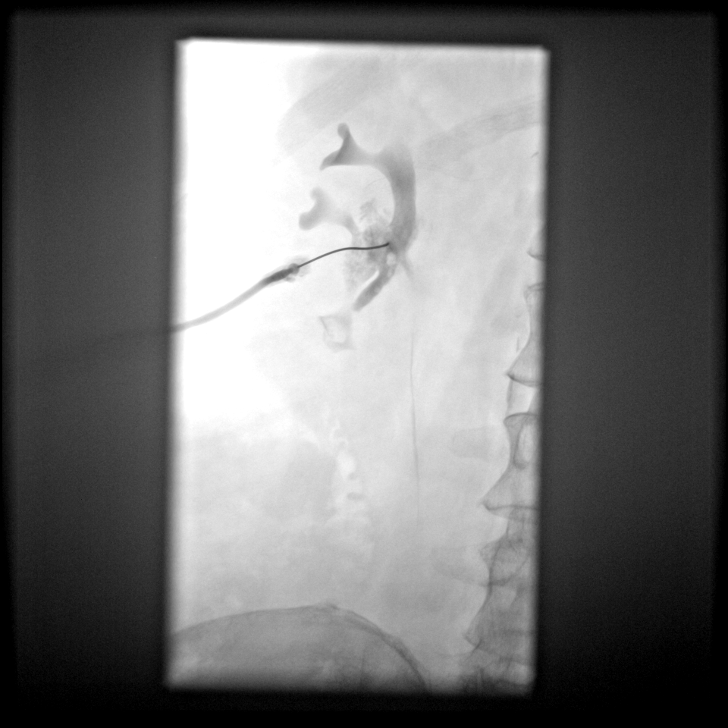

[Series 4: single · 1 of 1 slices shown (4 of 5)]
[im 1/1]
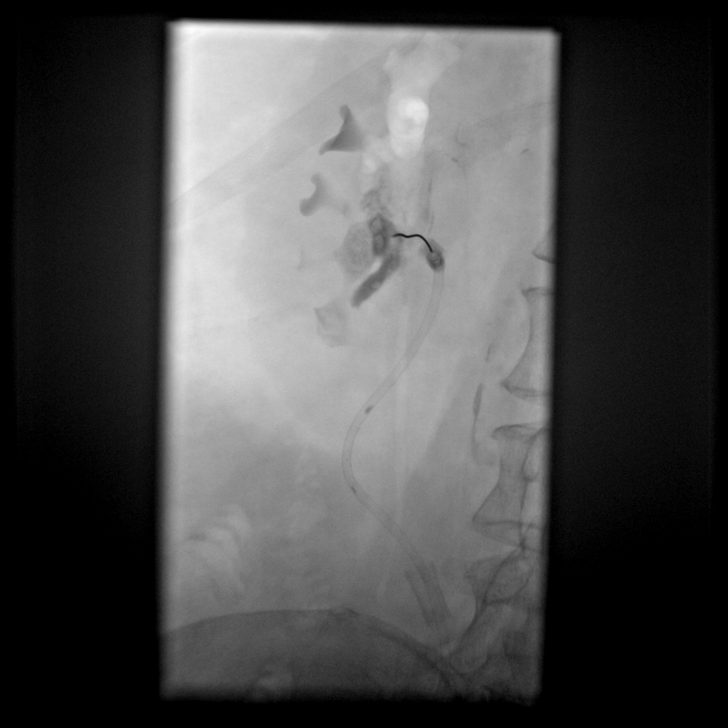

[Series 5: single · 1 of 1 slices shown (5 of 5)]
[im 1/1]
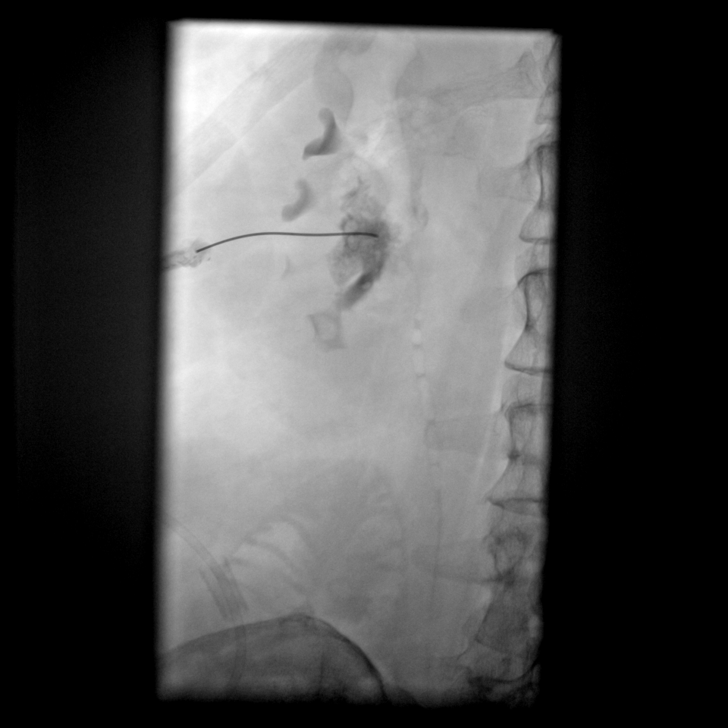

[Series 7: fl - angio · 2 of 2 slices shown]
[im 1/2]
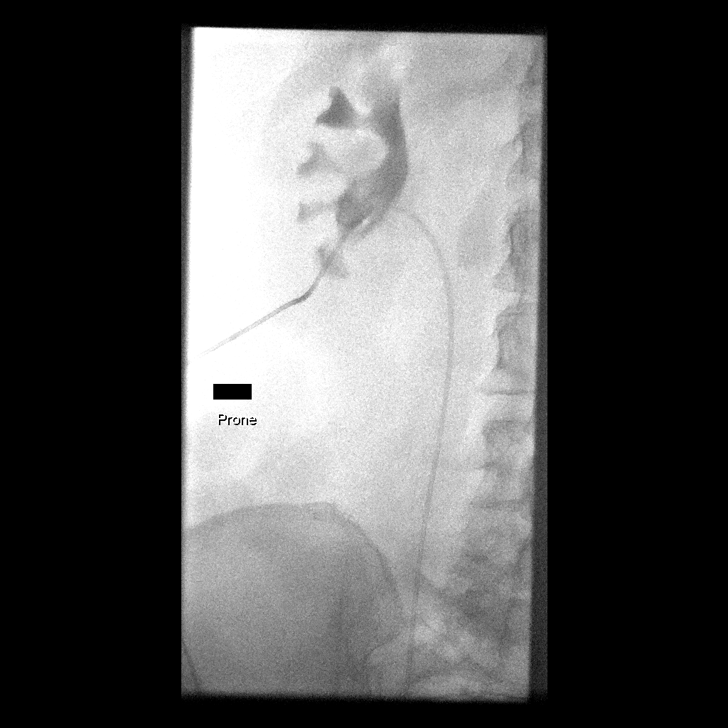
[im 2/2]
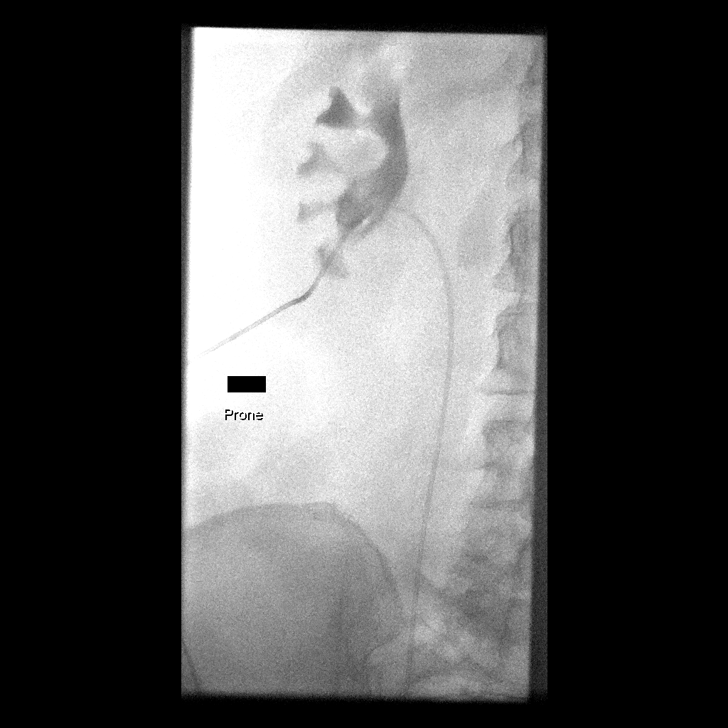

[7 of 7 positions shown; findings below may reference images not displayed]

MEDICATIONS:
Cleocin; The antibiotic was administered in an appropriate time
frame prior to skin puncture.

ANESTHESIA/SEDATION:
Fentanyl 200 mcg IV; Versed 5 mg IV

Moderate Sedation Time:  40

The patient was continuously monitored during the procedure by the
interventional radiology nurse under my direct supervision.

CONTRAST:  20mL 1J36SZ-DCC IOPAMIDOL (1J36SZ-DCC) INJECTION 61% -
administered into the collecting system(s)

FLUOROSCOPY TIME:  Fluoroscopy Time: 16 minutes 30 seconds (2.7
mGy).

COMPLICATIONS:
None immediate.

PROCEDURE:
Informed written consent was obtained from the patient after a
thorough discussion of the procedural risks, benefits and
alternatives. All questions were addressed. Maximal Sterile Barrier
Technique was utilized including caps, mask, sterile gowns, sterile
gloves, sterile drape, hand hygiene and skin antiseptic. A timeout
was performed prior to the initiation of the procedure.

The left back was prepped and draped in a sterile fashion. 1%
lidocaine was utilized for local anesthesia. Spot images were
obtained. Under fluoroscopic guidance, a 21 gauge needle was
inserted into the renal pelvis. Contrast and air were injected. A 21
gauge needle was then advanced into the lower pole calyx under
fluoroscopic guidance and removed over a 018 wire which was up sized
to a 3 J. a cobra 2 catheter was inserted. A glidewire was then
advanced through the Cobra catheter and into the ureter. The cobra
catheter was advanced over the glidewire to the distal ureter. It
was sewn in place.
FINDINGS: The initial series of images demonstrate a large renal calculus in
the pelvis is well as a small are calculus in the lower pole calyx.

Imaging demonstrates access into the left renal collecting system
via a lower pole calyx. The final image demonstrates a nephro
ureteral catheter extending to the bladder.
IMPRESSION: Successful left lower pole nephro ureteral catheter placement.

## 2017-08-26 NOTE — Discharge Instructions (Signed)

## 2017-10-16 ENCOUNTER — Ambulatory Visit
Admission: RE | Admit: 2017-10-16 | Discharge: 2017-10-16 | Disposition: A | Discharge status: Home / Self Care | Payer: BLUE CROSS/BLUE SHIELD | Source: Ambulatory Visit | Attending: Ophthalmology | Admitting: Ophthalmology

## 2017-10-16 ENCOUNTER — Ambulatory Visit: Payer: BLUE CROSS/BLUE SHIELD | Admitting: Anesthesiology

## 2017-10-16 ENCOUNTER — Encounter
Admission: RE | Disposition: A | Discharge status: Home / Self Care | Payer: Self-pay | Source: Ambulatory Visit | Attending: Ophthalmology

## 2017-10-16 DIAGNOSIS — Z87440 Personal history of urinary (tract) infections: Secondary | ICD-10-CM | POA: Insufficient documentation

## 2017-10-16 DIAGNOSIS — Z87442 Personal history of urinary calculi: Secondary | ICD-10-CM | POA: Insufficient documentation

## 2017-10-16 DIAGNOSIS — E119 Type 2 diabetes mellitus without complications: Secondary | ICD-10-CM | POA: Diagnosis not present

## 2017-10-16 DIAGNOSIS — R011 Cardiac murmur, unspecified: Secondary | ICD-10-CM | POA: Diagnosis not present

## 2017-10-16 DIAGNOSIS — Z87891 Personal history of nicotine dependence: Secondary | ICD-10-CM | POA: Diagnosis not present

## 2017-10-16 DIAGNOSIS — Z936 Other artificial openings of urinary tract status: Secondary | ICD-10-CM

## 2017-10-16 DIAGNOSIS — Z881 Allergy status to other antibiotic agents status: Secondary | ICD-10-CM | POA: Diagnosis not present

## 2017-10-16 DIAGNOSIS — C689 Malignant neoplasm of urinary organ, unspecified: Secondary | ICD-10-CM

## 2017-10-16 DIAGNOSIS — I10 Essential (primary) hypertension: Secondary | ICD-10-CM | POA: Insufficient documentation

## 2017-10-16 DIAGNOSIS — H2512 Age-related nuclear cataract, left eye: Secondary | ICD-10-CM | POA: Diagnosis not present

## 2017-10-16 HISTORY — DX: Gastro-esophageal reflux disease without esophagitis: K21.9

## 2017-10-16 HISTORY — DX: Cardiac murmur, unspecified: R01.1

## 2017-10-16 HISTORY — PX: CATARACT EXTRACTION W/PHACO: SHX586

## 2017-10-16 HISTORY — DX: Other specified soft tissue disorders: M79.89

## 2017-10-16 LAB — GLUCOSE, CAPILLARY
GLUCOSE-CAPILLARY: 168 mg/dL — AB (ref 65–99)
GLUCOSE-CAPILLARY: 178 mg/dL — AB (ref 65–99)

## 2017-10-16 SURGERY — PHACOEMULSIFICATION, CATARACT, WITH IOL INSERTION
Anesthesia: Monitor Anesthesia Care | Site: Eye | Laterality: Left | Wound class: Clean

## 2017-10-16 MED ORDER — LIDOCAINE HCL (PF) 2 % IJ SOLN
INTRAOCULAR | Status: DC | PRN
  Administered 2017-10-16: 2 mL via INTRAMUSCULAR

## 2017-10-16 MED ORDER — NA HYALUR & NA CHOND-NA HYALUR 0.4-0.35 ML IO KIT
PACK | INTRAOCULAR | Status: DC | PRN
  Administered 2017-10-16: 1 mL via INTRAOCULAR

## 2017-10-16 MED ORDER — BRIMONIDINE TARTRATE-TIMOLOL 0.2-0.5 % OP SOLN
OPHTHALMIC | Status: DC | PRN
  Administered 2017-10-16: 1 [drp] via OPHTHALMIC

## 2017-10-16 MED ORDER — MOXIFLOXACIN HCL 0.5 % OP SOLN
1.0000 [drp] | OPHTHALMIC | Status: DC | PRN
  Administered 2017-10-16 (×3): 1 [drp] via OPHTHALMIC

## 2017-10-16 MED ORDER — MOXIFLOXACIN HCL 0.5 % OP SOLN
OPHTHALMIC | Status: DC | PRN
  Administered 2017-10-16: 0.2 mL via OPHTHALMIC

## 2017-10-16 MED ORDER — MIDAZOLAM HCL 2 MG/2ML IJ SOLN
INTRAMUSCULAR | Status: DC | PRN
  Administered 2017-10-16 (×2): 1 mg via INTRAVENOUS

## 2017-10-16 MED ORDER — ARMC OPHTHALMIC DILATING DROPS
1.0000 "application " | OPHTHALMIC | Status: DC | PRN
  Administered 2017-10-16 (×3): 1 via OPHTHALMIC

## 2017-10-16 MED ORDER — LACTATED RINGERS IV SOLN: INTRAVENOUS | Status: DC

## 2017-10-16 MED ORDER — FENTANYL CITRATE (PF) 100 MCG/2ML IJ SOLN
INTRAMUSCULAR | Status: DC | PRN
  Administered 2017-10-16 (×2): 50 ug via INTRAVENOUS

## 2017-10-16 MED ORDER — EPINEPHRINE PF 1 MG/ML IJ SOLN
INTRAOCULAR | Status: DC | PRN
  Administered 2017-10-16: 92 mL via OPHTHALMIC

## 2017-10-16 SURGICAL SUPPLY — 18 items
ACRYSOF IQ TORIC ASTIGMATISMS INTRAOCULAR LENS (Intraocular Lens) ×3 IMPLANT
CANNULA ANT/CHMB 27GA (MISCELLANEOUS) ×3 IMPLANT
GLOVE SURG LX 7.5 STRW (GLOVE) ×2
GLOVE SURG LX STRL 7.5 STRW (GLOVE) ×1 IMPLANT
GLOVE SURG TRIUMPH 8.0 PF LTX (GLOVE) ×3 IMPLANT
GOWN STRL REUS W/ TWL LRG LVL3 (GOWN DISPOSABLE) ×2 IMPLANT
GOWN STRL REUS W/TWL LRG LVL3 (GOWN DISPOSABLE) ×4
MARKER SKIN DUAL TIP RULER LAB (MISCELLANEOUS) ×3 IMPLANT
NEEDLE FILTER BLUNT 18X 1/2SAF (NEEDLE) ×2
NEEDLE FILTER BLUNT 18X1 1/2 (NEEDLE) ×1 IMPLANT
PACK CATARACT BRASINGTON (MISCELLANEOUS) ×3 IMPLANT
PACK EYE AFTER SURG (MISCELLANEOUS) ×3 IMPLANT
PACK OPTHALMIC (MISCELLANEOUS) ×3 IMPLANT
SYR 3ML LL SCALE MARK (SYRINGE) ×3 IMPLANT
SYR 5ML LL (SYRINGE) ×3 IMPLANT
SYR TB 1ML LUER SLIP (SYRINGE) ×3 IMPLANT
WATER STERILE IRR 500ML POUR (IV SOLUTION) ×3 IMPLANT
WIPE NON LINTING 3.25X3.25 (MISCELLANEOUS) ×3 IMPLANT

## 2017-10-16 NOTE — Anesthesia Preprocedure Evaluation (Signed)
Anesthesia Evaluation  Patient identified by MRN, date of birth, ID band Patient awake    Reviewed: Allergy & Precautions, H&P , NPO status , Patient's Chart, lab work & pertinent test results  History of Anesthesia Complications (+) DIFFICULT AIRWAY and history of anesthetic complications  Airway Mallampati: III  TM Distance: <3 FB     Dental no notable dental hx.    Pulmonary former smoker,    Pulmonary exam normal        Cardiovascular hypertension, Normal cardiovascular exam     Neuro/Psych    GI/Hepatic Medicated,  Endo/Other  diabetes  Renal/GU      Musculoskeletal   Abdominal   Peds  Hematology negative hematology ROS (+)   Anesthesia Other Findings   Reproductive/Obstetrics                             Anesthesia Physical Anesthesia Plan  ASA: II  Anesthesia Plan: MAC   Post-op Pain Management:    Induction:   PONV Risk Score and Plan:   Airway Management Planned:   Additional Equipment:   Intra-op Plan:   Post-operative Plan:   Informed Consent: I have reviewed the patients History and Physical, chart, labs and discussed the procedure including the risks, benefits and alternatives for the proposed anesthesia with the patient or authorized representative who has indicated his/her understanding and acceptance.     Plan Discussed with:   Anesthesia Plan Comments:         Anesthesia Quick Evaluation

## 2017-10-16 NOTE — H&P (Signed)
The History and Physical notes are on paper, have been signed, and are to be scanned. The patient remains stable and unchanged from the H&P.   Previous H&P reviewed, patient examined, and there are no changes.  Diamond Sosa 10/16/2017 7:46 AM

## 2017-10-16 NOTE — Anesthesia Procedure Notes (Signed)
Procedure Name: MAC Performed by: Lind Guest, CRNA Pre-anesthesia Checklist: Patient identified, Emergency Drugs available, Suction available, Patient being monitored and Timeout performed Patient Re-evaluated:Patient Re-evaluated prior to induction Oxygen Delivery Method: Nasal cannula

## 2017-10-16 NOTE — Anesthesia Postprocedure Evaluation (Signed)
Anesthesia Post Note  Patient: Diamond Sosa  Procedure(s) Performed: CATARACT EXTRACTION PHACO AND INTRAOCULAR LENS PLACEMENT (IOC) LEFT DIABETIC TORIC (Left Eye)  Patient location during evaluation: PACU Anesthesia Type: MAC Level of consciousness: awake and alert Pain management: pain level controlled Vital Signs Assessment: post-procedure vital signs reviewed and stable Respiratory status: spontaneous breathing Cardiovascular status: blood pressure returned to baseline Postop Assessment: no headache Anesthetic complications: no    Jaci Standard, III,  Jennalynn Rivard D

## 2017-10-16 NOTE — Op Note (Signed)
LOCATION:  [facility]   PREOPERATIVE DIAGNOSIS:  Nuclear sclerotic cataract of the left eye.  H25.12  POSTOPERATIVE DIAGNOSIS:  Nuclear sclerotic cataract of the left eye.   PROCEDURE:  Phacoemulsification with Toric posterior chamber intraocular lens placement of the left eye.   LENS:  Implant Name Type Inv. Item Serial No. Manufacturer Lot No. LRB No. Used  ACRYSOF IQ TORIC ASTIGMATISMS INTRAOCULAR LENS Intraocular Lens  [id number]   Left 1  [id number] 10.5 D Toric intraocular lens with 3.00 diopters of cylindrical power with axis orientation at 13 degrees.   ULTRASOUND TIME: 20 % of 1 minutes, 49 seconds.  CDE 21.4   SURGEON:  Wyonia Hough, MD   ANESTHESIA:  Topical with tetracaine drops and 2% Xylocaine jelly, augmented with 1% preservative-free intracameral lidocaine.  COMPLICATIONS:  None.   DESCRIPTION OF PROCEDURE:  The patient was identified in the holding room and transported to the operating suite and placed in the supine position under the operating microscope.  The left eye was identified as the operative eye, and it was prepped and draped in the usual sterile ophthalmic fashion.    A clear-corneal paracentesis incision was made at the 1:30 position.  0.5 ml of preservative-free 1% lidocaine was injected into the anterior chamber. The anterior chamber was filled with Viscoat.  A 2.4 millimeter near clear corneal incision was then made at the 10:30 position.  A cystotome and capsulorrhexis forceps were then used to make a curvilinear capsulorrhexis.  Hydrodissection and hydrodelineation were then performed using balanced salt solution.   Phacoemulsification was then used in stop and chop fashion to remove the lens, nucleus and epinucleus.  The remaining cortex was aspirated using the irrigation and aspiration handpiece.  Provisc viscoelastic was then placed into the capsular bag to distend it for lens placement.  The Verion digital marker was used to align the  implant at the intended axis.   A 10.5 diopter lens was then injected into the capsular bag.  It was rotated clockwise until the axis marks on the lens were approximately 15 degrees in the counterclockwise direction to the intended alignment.  The viscoelastic was aspirated from the eye using the irrigation aspiration handpiece.  Then, a Koch spatula through the sideport incision was used to rotate the lens in a clockwise direction until the axis markings of the intraocular lens were lined up with the Verion alignment.  Balanced salt solution was then used to hydrate the wounds. Vigamox 0.2 ml of a 1mg  per ml solution was injected into the anterior chamber for a dose of 0.2 mg of intracameral antibiotic at the completion of the case.    The eye was noted to have a physiologic pressure and there was no wound leak noted.   Timolol and Brimonidine drops were applied to the eye.  The patient was taken to the recovery room in stable condition having had no complications of anesthesia or surgery.  Bethany Cumming 10/16/2017, 8:20 AM

## 2017-10-16 NOTE — Transfer of Care (Signed)
Immediate Anesthesia Transfer of Care Note  Patient: Diamond Sosa  Procedure(s) Performed: CATARACT EXTRACTION PHACO AND INTRAOCULAR LENS PLACEMENT (IOC) LEFT DIABETIC TORIC (Left Eye)  Patient Location: PACU  Anesthesia Type: MAC  Level of Consciousness: awake, alert  and patient cooperative  Airway and Oxygen Therapy: Patient Spontanous Breathing and Patient connected to supplemental oxygen  Post-op Assessment: Post-op Vital signs reviewed, Patient's Cardiovascular Status Stable, Respiratory Function Stable, Patent Airway and No signs of Nausea or vomiting  Post-op Vital Signs: Reviewed and stable  Complications: No apparent anesthesia complications

## 2017-10-17 ENCOUNTER — Encounter: Payer: Self-pay | Admitting: Ophthalmology

## 2017-10-31 NOTE — Discharge Instructions (Signed)

## 2017-11-06 ENCOUNTER — Encounter: Payer: Self-pay | Admitting: Anesthesiology

## 2017-11-12 MED ORDER — OXYCODONE HCL 5 MG/5ML PO SOLN: 5.0000 mg | Freq: Once | ORAL | Status: DC | PRN

## 2017-11-12 MED ORDER — OXYCODONE HCL 5 MG PO TABS: 5.0000 mg | ORAL_TABLET | Freq: Once | ORAL | Status: DC | PRN

## 2017-11-12 MED ORDER — ACETAMINOPHEN 325 MG PO TABS: 325.0000 mg | ORAL_TABLET | Freq: Once | ORAL | Status: DC

## 2017-11-12 MED ORDER — ACETAMINOPHEN 160 MG/5ML PO SOLN: 325.0000 mg | Freq: Once | ORAL | Status: DC

## 2017-11-27 ENCOUNTER — Ambulatory Visit: Admission: RE | Admit: 2017-11-27 | Payer: BLUE CROSS/BLUE SHIELD | Source: Ambulatory Visit | Admitting: Ophthalmology

## 2017-11-27 SURGERY — PHACOEMULSIFICATION, CATARACT, WITH IOL INSERTION
Anesthesia: Topical | Laterality: Right

## 2019-08-21 ENCOUNTER — Ambulatory Visit: Payer: BC Managed Care – PPO | Attending: Internal Medicine

## 2019-08-21 DIAGNOSIS — Z23 Encounter for immunization: Secondary | ICD-10-CM

## 2019-08-21 NOTE — Progress Notes (Signed)
   Covid-19 Vaccination Clinic  Name:  Diamond Sosa    MRN: [id number] DOB: June 17, 1955  08/21/2019  Diamond Sosa was observed post Covid-19 immunization for 15 minutes without incident. She was provided with Vaccine Information Sheet and instruction to access the V-Safe system.   Diamond Sosa was instructed to call 911 with any severe reactions post vaccine: [name] Difficulty breathing  . Swelling of face and throat  . A fast heartbeat  . A bad rash all over body  . Dizziness and weakness   Immunizations Administered    Name Date Dose VIS Date Route   Moderna COVID-19 Vaccine 08/21/2019 11:02 AM 0.5 mL 05/12/2019 Intramuscular   Manufacturer: Moderna   Lot: [id number]   StarrPO:[id number]

## 2019-09-23 ENCOUNTER — Ambulatory Visit: Payer: BC Managed Care – PPO

## 2019-09-30 ENCOUNTER — Ambulatory Visit: Payer: BC Managed Care – PPO

## 2020-01-12 ENCOUNTER — Ambulatory Visit: Payer: BC Managed Care – PPO | Admitting: Dietician

## 2020-02-04 ENCOUNTER — Encounter: Payer: Self-pay | Admitting: Dietician

## 2020-02-04 NOTE — Progress Notes (Signed)
Have not heard back from patient to reschedule her cancelled appointment from 01/12/20. Sent notification to referring provider.

## 2022-05-14 ENCOUNTER — Other Ambulatory Visit: Payer: Self-pay

## 2022-05-14 ENCOUNTER — Emergency Department: Payer: BC Managed Care – PPO

## 2022-05-14 ENCOUNTER — Inpatient Hospital Stay
Admission: EM | Admit: 2022-05-14 | Discharge: 2022-05-18 | DRG: 871 | Disposition: A | Discharge status: Home / Self Care | Payer: BC Managed Care – PPO | Attending: Obstetrics and Gynecology | Admitting: Obstetrics and Gynecology

## 2022-05-14 DIAGNOSIS — E039 Hypothyroidism, unspecified: Secondary | ICD-10-CM

## 2022-05-14 DIAGNOSIS — K219 Gastro-esophageal reflux disease without esophagitis: Secondary | ICD-10-CM | POA: Diagnosis present

## 2022-05-14 DIAGNOSIS — J9601 Acute respiratory failure with hypoxia: Secondary | ICD-10-CM

## 2022-05-14 DIAGNOSIS — Z7984 Long term (current) use of oral hypoglycemic drugs: Secondary | ICD-10-CM

## 2022-05-14 DIAGNOSIS — A419 Sepsis, unspecified organism: Secondary | ICD-10-CM | POA: Diagnosis not present

## 2022-05-14 DIAGNOSIS — Z79890 Hormone replacement therapy (postmenopausal): Secondary | ICD-10-CM

## 2022-05-14 DIAGNOSIS — Z20822 Contact with and (suspected) exposure to covid-19: Secondary | ICD-10-CM | POA: Diagnosis present

## 2022-05-14 DIAGNOSIS — Z87891 Personal history of nicotine dependence: Secondary | ICD-10-CM

## 2022-05-14 DIAGNOSIS — F419 Anxiety disorder, unspecified: Secondary | ICD-10-CM | POA: Diagnosis present

## 2022-05-14 DIAGNOSIS — I10 Essential (primary) hypertension: Secondary | ICD-10-CM | POA: Diagnosis present

## 2022-05-14 DIAGNOSIS — J101 Influenza due to other identified influenza virus with other respiratory manifestations: Secondary | ICD-10-CM | POA: Diagnosis not present

## 2022-05-14 DIAGNOSIS — E785 Hyperlipidemia, unspecified: Secondary | ICD-10-CM | POA: Insufficient documentation

## 2022-05-14 DIAGNOSIS — R0902 Hypoxemia: Secondary | ICD-10-CM

## 2022-05-14 DIAGNOSIS — E119 Type 2 diabetes mellitus without complications: Secondary | ICD-10-CM

## 2022-05-14 DIAGNOSIS — Z79899 Other long term (current) drug therapy: Secondary | ICD-10-CM

## 2022-05-14 LAB — CBC
HCT: 42.6 % (ref 36.0–46.0)
Hemoglobin: 13.4 g/dL (ref 12.0–15.0)
MCH: 28.8 pg (ref 26.0–34.0)
MCHC: 31.5 g/dL (ref 30.0–36.0)
MCV: 91.6 fL (ref 80.0–100.0)
Platelets: 308 10*3/uL (ref 150–400)
RBC: 4.65 MIL/uL (ref 3.87–5.11)
RDW: 14.1 % (ref 11.5–15.5)
WBC: 11 10*3/uL — ABNORMAL HIGH (ref 4.0–10.5)
nRBC: 0 % (ref 0.0–0.2)

## 2022-05-14 LAB — TROPONIN I (HIGH SENSITIVITY)
Troponin I (High Sensitivity): 52 ng/L — ABNORMAL HIGH (ref <18)
Troponin I (High Sensitivity): 62 ng/L — ABNORMAL HIGH (ref <18)

## 2022-05-14 LAB — BASIC METABOLIC PANEL
Anion gap: 10 (ref 5–15)
BUN: 11 mg/dL (ref 8–23)
CO2: 23 mmol/L (ref 22–32)
Calcium: 9.5 mg/dL (ref 8.9–10.3)
Chloride: 104 mmol/L (ref 98–111)
Creatinine, Ser: 0.82 mg/dL (ref 0.44–1.00)
GFR, Estimated: >60 mL/min (ref >60)
Glucose, Bld: 227 mg/dL — ABNORMAL HIGH (ref 70–99)
Potassium: 4.1 mmol/L (ref 3.5–5.1)
Sodium: 137 mmol/L (ref 135–145)

## 2022-05-14 LAB — RESP PANEL BY RT-PCR (FLU A&B, COVID) ARPGX2
Influenza A by PCR: POSITIVE — AB
Influenza B by PCR: NEGATIVE
SARS Coronavirus 2 by RT PCR: NEGATIVE

## 2022-05-14 MED ORDER — ALBUTEROL SULFATE (2.5 MG/3ML) 0.083% IN NEBU
2.5000 mg | INHALATION_SOLUTION | Freq: Once | RESPIRATORY_TRACT | Status: AC
  Administered 2022-05-15: 2.5 mg via RESPIRATORY_TRACT
  Filled 2022-05-14: qty 3

## 2022-05-14 MED ORDER — ONDANSETRON HCL 4 MG/2ML IJ SOLN
4.0000 mg | Freq: Once | INTRAMUSCULAR | Status: DC
  Filled 2022-05-14: qty 2

## 2022-05-14 MED ORDER — ACETAMINOPHEN 500 MG PO TABS
1000.0000 mg | ORAL_TABLET | Freq: Once | ORAL | Status: AC
  Administered 2022-05-15: 1000 mg via ORAL
  Filled 2022-05-14: qty 2

## 2022-05-14 MED ORDER — GUAIFENESIN-CODEINE 100-10 MG/5ML PO SOLN
5.0000 mL | Freq: Once | ORAL | Status: AC
  Administered 2022-05-15: 5 mL via ORAL
  Filled 2022-05-14: qty 5

## 2022-05-14 NOTE — ED Provider Triage Note (Signed)
Emergency Medicine Provider Triage Evaluation Note  Diamond Sosa , a 66 y.o. female  was evaluated in triage.  Pt complains of cough, congestion sob. Grandchildren are sick. No fever or chest pain  Review of Systems  Positive: cough, congestion sob Negative: fever  Physical Exam  BP (!) 158/89 (BP Location: Left Wrist)   Pulse (!) 107   Temp 100.1 F (37.8 C) (Oral)   Resp (!) 22   Ht '5\' 3"'$  (1.6 m)   Wt 114.3 kg   SpO2 92%   BMI 44.64 kg/m  Gen:   Awake, no distress   Resp:  Normal effort  MSK:   Moves extremities without difficulty  Other:    Medical Decision Making  Medically screening exam initiated at 8:06 PM.  Appropriate orders placed.  QUETZALLY CALLAS was informed that the remainder of the evaluation will be completed by another provider, this initial triage assessment does not replace that evaluation, and the importance of remaining in the ED until their evaluation is complete.     Marquette Old, PA-C 05/14/22 2007

## 2022-05-14 NOTE — ED Provider Notes (Signed)
[facility] Provider Note    Event Date/Time   First MD Initiated Contact with Patient 05/14/22 2259     (approximate)   History   Shortness of Breath   HPI  Diamond Sosa is a 66 y.o. female with history of hypertension, diabetes, hypothyroidism who presents emergency department with fevers, cough, body aches, shortness of breath that started yesterday.  Has had a COVID-vaccine but has not had the flu vaccine yet.  No vomiting or diarrhea.   History provided by patient and husband.    Past Medical History:  Diagnosis Date   Adrenal tumor    Anxiety    Arthritis    generalized   Cataracts, bilateral    Complication of anesthesia 2002   during galbladder surgery, pt was extubated in OR and did not breath, needed to be reintubated, pt remembers this and is concerned this will happen again.   Diabetes mellitus without complication (HCC)    Dysrhythmia    GERD (gastroesophageal reflux disease)    Heart murmur    Hypertension    Hypothyroidism    boarderline   IBS (irritable bowel syndrome)    Inflammatory bowel disease    Kidney stone    kidney stones   Microadenoma (HCC)    Pituitary tumor    Shortness of breath dyspnea    Swelling of lower extremity    Thyroid disease    hypothyroid    Past Surgical History:  Procedure Laterality Date   CATARACT EXTRACTION W/PHACO Left 10/16/2017   Procedure: CATARACT EXTRACTION PHACO AND INTRAOCULAR LENS PLACEMENT ([place]) LEFT DIABETIC TORIC;  Surgeon: Leandrew Koyanagi, MD;  Location: [place];  Service: Ophthalmology;  Laterality: Left;   CERVICAL CERCLAGE     CHOLECYSTECTOMY     COLONOSCOPY WITH PROPOFOL N/A 09/13/2015   Procedure: COLONOSCOPY WITH PROPOFOL;  Surgeon: Lollie Sails, MD;  Location: [facility] ENDOSCOPY;  Service: Endoscopy;  Laterality: N/A;   NEPHROLITHOTOMY Left 11/29/2015   Procedure: NEPHROLITHOTOMY PERCUTANEOUS WITH URETERAL STENT PLACEMENT /BIOPSIES;  Surgeon: Hollice Espy, MD;  Location: [facility] ORS;  Service: Urology;  Laterality: Left;   TONSILLECTOMY     WISDOM TOOTH EXTRACTION Bilateral    all 4    MEDICATIONS:  Prior to Admission medications   Medication Sig Start Date End Date Taking? Authorizing Provider  benzonatate (TESSALON) 100 MG capsule Take 100 mg by mouth 3 (three) times daily as needed for cough.    [provider]  cetirizine (ZYRTEC) 10 MG tablet Take 10 mg by mouth daily.    [provider]  cholecalciferol (VITAMIN D) 1000 UNITS tablet Take 2,000 Units by mouth daily after supper. Reported on 12/12/2015    [provider]  doxycycline (DORYX) 100 MG EC tablet Take 100 mg by mouth 2 (two) times daily.    [provider]  lisinopril (PRINIVIL,ZESTRIL) 20 MG tablet Take 5 mg by mouth daily.     [provider]  loratadine (CLARITIN) 10 MG tablet Take 10 mg by mouth daily as needed for allergies.    [provider]  metFORMIN (GLUCOPHAGE-XR) 750 MG 24 hr tablet Take 750 mg by mouth 2 (two) times daily.    [provider]  metoprolol succinate (TOPROL-XL) 50 MG 24 hr tablet Take 50 mg by mouth daily. Take with or immediately following a meal at 8pm.    [provider]  Multiple Vitamin (MULTI-VITAMINS) TABS Take by mouth.    [provider]  tamsulosin (  FLOMAX) 0.4 MG CAPS capsule Take 1 tablet by mouth daily until you pass the kidney stone or no longer have symptoms Patient not taking: Reported on 08/26/2017 04/10/17   Hinda Kehr, MD    Physical Exam   Triage Vital Signs: ED Triage Vitals  Enc Vitals Group     BP 05/14/22 1941 (!) 158/89     Pulse Rate 05/14/22 1941 (!) 107     Resp 05/14/22 1941 (!) 22     Temp 05/14/22 1941 100.1 F (37.8 C)     Temp Source 05/14/22 1941 Oral     SpO2 05/14/22 1941 92 %     Weight 05/14/22 1952 252 lb (114.3 kg)     Height 05/14/22 1952 '5\' 3"'$  (1.6 m)     Head Circumference --      Peak Flow --      Pain Score  05/14/22 1951 8     Pain Loc --      Pain Edu? --      Excl. in [place]? --     Most recent vital signs: Vitals:   05/15/22 0348 05/15/22 0625  BP:  (!) 115/57  Pulse:  83  Resp:  20  Temp: 99.1 F (37.3 C)   SpO2:  94%    CONSTITUTIONAL: Alert and oriented and responds appropriately to questions. Well-appearing; well-nourished HEAD: Normocephalic, atraumatic EYES: Conjunctivae clear, pupils appear equal, sclera nonicteric ENT: normal nose; moist mucous membranes NECK: Supple, normal ROM CARD: RRR; S1 and S2 appreciated; no murmurs, no clicks, no rubs, no gallops RESP: Patient is intermittently tachypneic with productive cough.  Slight scattered wheeze.  No rhonchi or rales.  No hypoxia at rest and no respiratory distress. ABD/GI: Normal bowel sounds; non-distended; soft, non-tender, no rebound, no guarding, no peritoneal signs BACK: The back appears normal EXT: Normal ROM in all joints; no deformity noted, no edema; no cyanosis SKIN: Normal color for age and race; warm; no rash on exposed skin NEURO: Moves all extremities equally, normal speech PSYCH: The patient's mood and manner are appropriate.   ED Results / Procedures / Treatments   LABS: (all labs ordered are listed, but only abnormal results are displayed) Labs Reviewed  RESP PANEL BY RT-PCR (FLU A&B, COVID) ARPGX2 - Abnormal; Notable for the following components:      Result Value   Influenza A by PCR POSITIVE (*)    All other components within normal limits  BASIC METABOLIC PANEL - Abnormal; Notable for the following components:   Glucose, Bld 227 (*)    All other components within normal limits  CBC - Abnormal; Notable for the following components:   WBC 11.0 (*)    All other components within normal limits  BASIC METABOLIC PANEL - Abnormal; Notable for the following components:   Glucose, Bld 234 (*)    All other components within normal limits  TROPONIN I (HIGH SENSITIVITY) - Abnormal; Notable for the  following components:   Troponin I (High Sensitivity) 52 (*)    All other components within normal limits  TROPONIN I (HIGH SENSITIVITY) - Abnormal; Notable for the following components:   Troponin I (High Sensitivity) 62 (*)    All other components within normal limits  CBC  HIV ANTIBODY (ROUTINE TESTING W REFLEX)     EKG:  EKG Interpretation  Date/Time:  Monday May 14 2022 19:59:23 EST Ventricular Rate:  108 PR Interval:  176 QRS Duration: 72 QT Interval:  330 QTC Calculation: 442 R Axis:  264 Text Interpretation: Sinus tachycardia Right superior axis deviation Anterior infarct , age undetermined Abnormal ECG When compared with ECG of 25-Jan-2015 01:13, QRS duration has decreased Confirmed by Pryor Curia [phone]) on 05/14/2022 11:11:31 PM         RADIOLOGY: My personal review and interpretation of imaging: Chest x-ray clear.  I have personally reviewed all radiology reports.   DG Chest 2 View  Result Date: 05/14/2022 CLINICAL DATA:  Shortness of breath EXAM: CHEST - 2 VIEW COMPARISON:  Chest x-ray 08/22/2010 FINDINGS: The heart size and mediastinal contours are within normal limits. Both lungs are clear. The visualized skeletal structures are unremarkable. IMPRESSION: No active cardiopulmonary disease. Electronically Signed   By: Ronney Asters M.D.   On: 05/14/2022 20:36     PROCEDURES:  Critical Care performed: Yes, see critical care procedure note(s)   CRITICAL CARE Performed by: Cyril Mourning Kaya Pottenger   Total critical care time: 45 minutes  Critical care time was exclusive of separately billable procedures and treating other patients.  Critical care was necessary to treat or prevent imminent or life-threatening deterioration.  Critical care was time spent personally by me on the following activities: development of treatment plan with patient and/or surrogate as well as nursing, discussions with consultants, evaluation of patient's response to treatment,  examination of patient, obtaining history from patient or surrogate, ordering and performing treatments and interventions, ordering and review of laboratory studies, ordering and review of radiographic studies, pulse oximetry and re-evaluation of patient's condition.   [name]1-3 Lead EKG Interpretation  Performed by: Olivya Sobol, Delice Bison, DO Authorized by: Taj Arteaga, Delice Bison, DO     Interpretation: normal     ECG rate:  93   ECG rate assessment: normal     Rhythm: sinus rhythm     Ectopy: none     Conduction: normal       IMPRESSION / MDM / ASSESSMENT AND PLAN / ED COURSE  I reviewed the triage vital signs and the nursing notes.    Patient here with fevers, cough and shortness of breath.  The patient is on the cardiac monitor to evaluate for evidence of arrhythmia and/or significant heart rate changes.   DIFFERENTIAL DIAGNOSIS (includes but not limited to):   Viral URI, pneumonia, PE, CHF, COPD   Patient's presentation is most consistent with acute presentation with potential threat to life or bodily function.   PLAN: Workup initiated from triage.  Patient has slight leukocytosis of 11,000.  Normal electrolytes and renal function.  Troponins minimally elevated but flat.  No old for comparison.  She is flu a positive.  Chest x-ray reviewed and interpreted by myself and the radiologist is clear.  EKG shows no ischemia.  Will give medications for symptomatic relief and obtain ambulatory sat.   MEDICATIONS GIVEN IN ED: Medications  atorvastatin (LIPITOR) tablet 20 mg (has no administration in time range)  lisinopril (ZESTRIL) tablet 5 mg (has no administration in time range)  metoprolol succinate (TOPROL-XL) 24 hr tablet 50 mg (has no administration in time range)  glipiZIDE (GLUCOTROL) tablet 10 mg (has no administration in time range)  cholecalciferol (VITAMIN D3) 25 MCG (1000 UNIT) tablet 2,000 Units (has no administration in time range)  multivitamin with minerals tablet 1 tablet (has  no administration in time range)  enoxaparin (LOVENOX) injection 57.5 mg (has no administration in time range)  0.9 %  sodium chloride infusion ( Intravenous New Bag/Given 05/15/22 0450)  acetaminophen (TYLENOL) tablet 650 mg (has no administration in time range)  Or  acetaminophen (TYLENOL) suppository 650 mg (has no administration in time range)  traZODone (DESYREL) tablet 25 mg (has no administration in time range)  oseltamivir (TAMIFLU) capsule 75 mg (75 mg Oral Given 05/15/22 0351)  chlorpheniramine-HYDROcodone (TUSSIONEX) 10-8 MG/5ML suspension 5 mL (5 mLs Oral Given 05/15/22 0651)  guaiFENesin (MUCINEX) 12 hr tablet 600 mg (has no administration in time range)  acetaminophen (TYLENOL) tablet 1,000 mg (1,000 mg Oral Given 05/15/22 0032)  guaiFENesin-codeine 100-10 MG/5ML solution 5 mL (5 mLs Oral Given 05/15/22 0032)  albuterol (PROVENTIL) (2.5 MG/3ML) 0.083% nebulizer solution 2.5 mg (2.5 mg Nebulization Given 05/15/22 0018)  ondansetron (ZOFRAN-ODT) disintegrating tablet 4 mg (4 mg Oral Given 05/15/22 0015)     ED COURSE: No oxygen desaturation with ambulation but patient states whenever she lies flat her oxygen saturation drops down to 86% while awake.  She does not have a known history of sleep apnea but is obese.  She does not feel comfortable going home.  We will place her on 2 L nasal cannula and discussed with the hospitalist for admission.   CONSULTS:  Consulted and discussed patient's case with hospitalist, Dr. Sidney Ace.  I have recommended admission and consulting physician agrees and will place admission orders.  Patient (and family if present) agree with this plan.   I reviewed all nursing notes, vitals, pertinent previous records.  All labs, EKGs, imaging ordered have been independently reviewed and interpreted by myself.    OUTSIDE RECORDS REVIEWED: Reviewed last PCP note with Dr. Lovie Macadamia on 05/17/2021.       FINAL CLINICAL IMPRESSION(S) / ED DIAGNOSES   Final  diagnoses:  Influenza A  Acute respiratory failure with hypoxia ([place])     Rx / DC Orders   ED Discharge Orders     None        Note:  This document was prepared using Dragon voice recognition software and may include unintentional dictation errors.   Othon Guardia, Delice Bison, DO 05/15/22 0700

## 2022-05-14 NOTE — ED Triage Notes (Signed)
Arrives from home via [facility].  C/O fever, nausea.  Watching grandchildren last night and this morning felt bad. Temp today 99.1.  P:  112.  CBG:  237.  178/100.  Hx htn and DM.

## 2022-05-14 NOTE — ED Triage Notes (Addendum)
Patient reports nasal congestion, diarrhea, shortness of breath, fever and cough as well as epigastric and back pain. States she had a near syncopal episode earlier today when getting up to the bathroom. AOX4. Resp even, unlabored while sitting in chair.

## 2022-05-15 ENCOUNTER — Encounter: Payer: Self-pay | Admitting: Family Medicine

## 2022-05-15 DIAGNOSIS — E785 Hyperlipidemia, unspecified: Secondary | ICD-10-CM | POA: Insufficient documentation

## 2022-05-15 DIAGNOSIS — J101 Influenza due to other identified influenza virus with other respiratory manifestations: Secondary | ICD-10-CM | POA: Diagnosis present

## 2022-05-15 DIAGNOSIS — R0902 Hypoxemia: Secondary | ICD-10-CM

## 2022-05-15 DIAGNOSIS — F419 Anxiety disorder, unspecified: Secondary | ICD-10-CM | POA: Diagnosis present

## 2022-05-15 DIAGNOSIS — E039 Hypothyroidism, unspecified: Secondary | ICD-10-CM

## 2022-05-15 DIAGNOSIS — A419 Sepsis, unspecified organism: Secondary | ICD-10-CM | POA: Diagnosis present

## 2022-05-15 DIAGNOSIS — Z87891 Personal history of nicotine dependence: Secondary | ICD-10-CM | POA: Diagnosis not present

## 2022-05-15 DIAGNOSIS — Z20822 Contact with and (suspected) exposure to covid-19: Secondary | ICD-10-CM | POA: Diagnosis present

## 2022-05-15 DIAGNOSIS — J9601 Acute respiratory failure with hypoxia: Secondary | ICD-10-CM

## 2022-05-15 DIAGNOSIS — Z79899 Other long term (current) drug therapy: Secondary | ICD-10-CM | POA: Diagnosis not present

## 2022-05-15 DIAGNOSIS — I10 Essential (primary) hypertension: Secondary | ICD-10-CM | POA: Diagnosis present

## 2022-05-15 DIAGNOSIS — E119 Type 2 diabetes mellitus without complications: Secondary | ICD-10-CM

## 2022-05-15 DIAGNOSIS — K219 Gastro-esophageal reflux disease without esophagitis: Secondary | ICD-10-CM | POA: Diagnosis present

## 2022-05-15 DIAGNOSIS — Z79890 Hormone replacement therapy (postmenopausal): Secondary | ICD-10-CM | POA: Diagnosis not present

## 2022-05-15 DIAGNOSIS — Z7984 Long term (current) use of oral hypoglycemic drugs: Secondary | ICD-10-CM | POA: Diagnosis not present

## 2022-05-15 LAB — CBC
HCT: 40.1 % (ref 36.0–46.0)
Hemoglobin: 12.8 g/dL (ref 12.0–15.0)
MCH: 29.2 pg (ref 26.0–34.0)
MCHC: 31.9 g/dL (ref 30.0–36.0)
MCV: 91.3 fL (ref 80.0–100.0)
Platelets: 291 10*3/uL (ref 150–400)
RBC: 4.39 MIL/uL (ref 3.87–5.11)
RDW: 14.2 % (ref 11.5–15.5)
WBC: 7.3 10*3/uL (ref 4.0–10.5)
nRBC: 0 % (ref 0.0–0.2)

## 2022-05-15 LAB — BASIC METABOLIC PANEL
Anion gap: 8 (ref 5–15)
BUN: 10 mg/dL (ref 8–23)
CO2: 26 mmol/L (ref 22–32)
Calcium: 9.3 mg/dL (ref 8.9–10.3)
Chloride: 104 mmol/L (ref 98–111)
Creatinine, Ser: 0.82 mg/dL (ref 0.44–1.00)
GFR, Estimated: >60 mL/min (ref >60)
Glucose, Bld: 234 mg/dL — ABNORMAL HIGH (ref 70–99)
Potassium: 3.8 mmol/L (ref 3.5–5.1)
Sodium: 138 mmol/L (ref 135–145)

## 2022-05-15 LAB — GLUCOSE, CAPILLARY
Glucose-Capillary: 180 mg/dL — ABNORMAL HIGH (ref 70–99)
Glucose-Capillary: 255 mg/dL — ABNORMAL HIGH (ref 70–99)

## 2022-05-15 LAB — HIV ANTIBODY (ROUTINE TESTING W REFLEX): HIV Screen 4th Generation wRfx: NONREACTIVE

## 2022-05-15 MED ORDER — ENOXAPARIN SODIUM 60 MG/0.6ML IJ SOSY
0.5000 mg/kg | PREFILLED_SYRINGE | INTRAMUSCULAR | Status: DC
  Administered 2022-05-15 – 2022-05-18 (×4): 57.5 mg via SUBCUTANEOUS
  Filled 2022-05-15 (×4): qty 0.6

## 2022-05-15 MED ORDER — ADULT MULTIVITAMIN W/MINERALS CH
1.0000 | ORAL_TABLET | Freq: Every day | ORAL | Status: DC
  Administered 2022-05-18: 1 via ORAL
  Filled 2022-05-15 (×4): qty 1

## 2022-05-15 MED ORDER — OSELTAMIVIR PHOSPHATE 75 MG PO CAPS
75.0000 mg | ORAL_CAPSULE | Freq: Two times a day (BID) | ORAL | Status: DC
  Administered 2022-05-15 – 2022-05-18 (×7): 75 mg via ORAL
  Filled 2022-05-15 (×8): qty 1

## 2022-05-15 MED ORDER — GLIPIZIDE 10 MG PO TABS
10.0000 mg | ORAL_TABLET | Freq: Two times a day (BID) | ORAL | Status: DC
  Administered 2022-05-15 – 2022-05-16 (×2): 10 mg via ORAL
  Filled 2022-05-15 (×4): qty 1

## 2022-05-15 MED ORDER — HYDROCOD POLI-CHLORPHE POLI ER 10-8 MG/5ML PO SUER
5.0000 mL | Freq: Two times a day (BID) | ORAL | Status: DC | PRN
  Administered 2022-05-15 – 2022-05-17 (×5): 5 mL via ORAL
  Filled 2022-05-15 (×5): qty 5

## 2022-05-15 MED ORDER — SODIUM CHLORIDE 0.9 % IV SOLN: INTRAVENOUS | Status: DC

## 2022-05-15 MED ORDER — TRAZODONE HCL 50 MG PO TABS
25.0000 mg | ORAL_TABLET | Freq: Every evening | ORAL | Status: DC | PRN
  Administered 2022-05-16 – 2022-05-17 (×2): 25 mg via ORAL
  Filled 2022-05-15 (×2): qty 1

## 2022-05-15 MED ORDER — LISINOPRIL 5 MG PO TABS
5.0000 mg | ORAL_TABLET | Freq: Every day | ORAL | Status: DC
  Administered 2022-05-15 – 2022-05-16 (×2): 5 mg via ORAL
  Filled 2022-05-15 (×2): qty 1

## 2022-05-15 MED ORDER — ATORVASTATIN CALCIUM 20 MG PO TABS
20.0000 mg | ORAL_TABLET | Freq: Every day | ORAL | Status: DC
  Administered 2022-05-17: 20 mg via ORAL
  Filled 2022-05-15 (×4): qty 1

## 2022-05-15 MED ORDER — ACETAMINOPHEN 325 MG PO TABS
650.0000 mg | ORAL_TABLET | Freq: Four times a day (QID) | ORAL | Status: DC | PRN
  Administered 2022-05-15 – 2022-05-17 (×6): 650 mg via ORAL
  Filled 2022-05-15 (×6): qty 2

## 2022-05-15 MED ORDER — METOPROLOL SUCCINATE ER 50 MG PO TB24
50.0000 mg | ORAL_TABLET | Freq: Every day | ORAL | Status: DC
  Administered 2022-05-15 – 2022-05-18 (×4): 50 mg via ORAL
  Filled 2022-05-15 (×4): qty 1

## 2022-05-15 MED ORDER — VITAMIN D 25 MCG (1000 UNIT) PO TABS
2000.0000 [IU] | ORAL_TABLET | Freq: Every day | ORAL | Status: DC
  Administered 2022-05-15 – 2022-05-17 (×3): 2000 [IU] via ORAL
  Filled 2022-05-15 (×3): qty 2

## 2022-05-15 MED ORDER — ACETAMINOPHEN 650 MG RE SUPP: 650.0000 mg | Freq: Four times a day (QID) | RECTAL | Status: DC | PRN

## 2022-05-15 MED ORDER — ONDANSETRON 4 MG PO TBDP
4.0000 mg | ORAL_TABLET | Freq: Once | ORAL | Status: AC
  Administered 2022-05-15: 4 mg via ORAL
  Filled 2022-05-15: qty 1

## 2022-05-15 MED ORDER — GUAIFENESIN ER 600 MG PO TB12
600.0000 mg | ORAL_TABLET | Freq: Two times a day (BID) | ORAL | Status: DC
  Administered 2022-05-15 – 2022-05-18 (×7): 600 mg via ORAL
  Filled 2022-05-15 (×8): qty 1

## 2022-05-15 NOTE — ED Notes (Signed)
Ambulatory pulse ox 92% RA. Patient had shortness of breath while ambulating.

## 2022-05-15 NOTE — Assessment & Plan Note (Signed)
-   The will be placed on supplemental coverage with NovoLog. - We will hold off metformin and continue Glucotrol.

## 2022-05-15 NOTE — Assessment & Plan Note (Signed)
-   We will continue statin therapy. 

## 2022-05-15 NOTE — ED Notes (Signed)
Patient's O2 sat is 95%. Patient states she feels short of breath. Will place 2L [place] for supplemental support.

## 2022-05-15 NOTE — H&P (Addendum)
[place]   PATIENT NAME: Diamond Sosa    MR#:  [id number]  DATE OF BIRTH:  [birth year]  DATE OF ADMISSION:  05/14/2022  PRIMARY CARE PHYSICIAN: Juluis Pitch, MD   Patient is coming from: Home  REQUESTING/REFERRING PHYSICIAN: Ward, Delice Bison, DO  CHIEF COMPLAINT:   Chief Complaint  Patient presents with   Shortness of Breath    HISTORY OF PRESENT ILLNESS:  Diamond Sosa is a 66 y.o. Caucasian female with medical history significant for anxiety, osteoarthritis, type 2 diabetes mellitus, GERD, hypertension, hypothyroidism, IBS, urolithiasis, hypothyroidism and pituitary micro adenoma, who presented to the emergency room with acute onset of worsening dyspnea with associated cough productive of yellowish sputum over the last few days as well as fever and chills and generalized aching Mainly in the back.  She admits to headache with associated mild dizziness.  She has been having wheezing with her cough.  She admits to chest pain with her cough as well.  No dysuria, oliguria or hematuria or flank pain.  No paresthesias or focal muscle weakness. Patient stated whenever she lies flat her oxygen saturation drops down to 86% while awake.   ED Course: Upon presentation to the emergency room, BP was 158/89 with a temperature of 100.1 and respiratory rate of 22 with heart rate of 107.  BP has been as high as 178/100 earlier.  Pulse symmetry was 92% on room air and it dropped with ambulation and therefore the patient was placed on 2 L of O2 by nasal cannula.  Labs revealed a blood glucose of 227 and high sensitive troponin I of 5262 with CBC with WBC of 11.  Influenza A antigen came back positive and influenza B was negative as well as COVID-19 PCR. EKG as reviewed by me : EKG showed sinus tachycardia with a rate of 108 with right axis deviation and poor R wave progression and nonspecific Imaging: Two-view chest x-ray showed no acute cardiopulmonary disease.  The patient was given IV  Zofran, 1 g of p.o. Tylenol, and he was albuterol and Tussionex.  I added p.o. Tamiflu.  The patient will be admitted to a medical telemetry bed for further evaluation and management. PAST MEDICAL HISTORY:   Past Medical History:  Diagnosis Date   Adrenal tumor    Anxiety    Arthritis    generalized   Cataracts, bilateral    Complication of anesthesia 2002   during galbladder surgery, pt was extubated in OR and did not breath, needed to be reintubated, pt remembers this and is concerned this will happen again.   Diabetes mellitus without complication (HCC)    Dysrhythmia    GERD (gastroesophageal reflux disease)    Heart murmur    Hypertension    Hypothyroidism    boarderline   IBS (irritable bowel syndrome)    Inflammatory bowel disease    Kidney stone    kidney stones   Microadenoma ([place])    Pituitary tumor    Shortness of breath dyspnea    Swelling of lower extremity    Thyroid disease    hypothyroid    PAST SURGICAL HISTORY:   Past Surgical History:  Procedure Laterality Date   CATARACT EXTRACTION W/PHACO Left 10/16/2017   Procedure: CATARACT EXTRACTION PHACO AND INTRAOCULAR LENS PLACEMENT ([place]) LEFT DIABETIC TORIC;  Surgeon: Leandrew Koyanagi, MD;  Location: [place];  Service: Ophthalmology;  Laterality: Left;   CERVICAL CERCLAGE     CHOLECYSTECTOMY     COLONOSCOPY  WITH PROPOFOL N/A 09/13/2015   Procedure: COLONOSCOPY WITH PROPOFOL;  Surgeon: Lollie Sails, MD;  Location: [facility] ENDOSCOPY;  Service: Endoscopy;  Laterality: N/A;   NEPHROLITHOTOMY Left 11/29/2015   Procedure: NEPHROLITHOTOMY PERCUTANEOUS WITH URETERAL STENT PLACEMENT /BIOPSIES;  Surgeon: Hollice Espy, MD;  Location: [facility] ORS;  Service: Urology;  Laterality: Left;   TONSILLECTOMY     WISDOM TOOTH EXTRACTION Bilateral    all 4    SOCIAL HISTORY:   Social History   Tobacco Use   Smoking status: Former    Types: Cigarettes    Quit date: 07/31/2000    Years since quitting: 21.8    Smokeless tobacco: Never  Substance Use Topics   Alcohol use: Yes    Alcohol/week: 0.0 standard drinks of alcohol    Comment: occasional    FAMILY HISTORY:   Family History  Problem Relation Age of Onset   Prostate cancer Father    Urolithiasis Mother    Kidney disease Neg Hx    Kidney cancer Neg Hx     DRUG ALLERGIES:   Allergies  Allergen Reactions   Bee Venom Shortness Of Breath   Adhesive [Tape] Other (See Comments)    Whelp on skin.   Cefuroxime Axetil     REACTION: GI distress   Erythromycin Base Nausea Only    Extreme nausea    REVIEW OF SYSTEMS:   ROS As per history of present illness. All pertinent systems were reviewed above. Constitutional, HEENT, cardiovascular, respiratory, GI, GU, musculoskeletal, neuro, psychiatric, endocrine, integumentary and hematologic systems were reviewed and are otherwise negative/unremarkable except for positive findings mentioned above in the HPI.   MEDICATIONS AT HOME:   Prior to Admission medications   Medication Sig Start Date End Date Taking? Authorizing Provider  atorvastatin (LIPITOR) 20 MG tablet Take 1 tablet by mouth daily.   Yes [provider]  cholecalciferol (VITAMIN D) 1000 UNITS tablet Take 2,000 Units by mouth daily after supper. Reported on 12/12/2015   Yes [provider]  glipiZIDE (GLUCOTROL) 10 MG tablet Take 10 mg by mouth 2 (two) times daily before a meal. 11/17/21  Yes [provider]  lisinopril (ZESTRIL) 5 MG tablet Take 1 tablet by mouth daily. 11/29/21  Yes [provider]  metFORMIN (GLUCOPHAGE-XR) 500 MG 24 hr tablet Take 2 tablets by mouth 2 (two) times daily with a meal. 12/13/21  Yes [provider]  Multiple Vitamin (MULTI-VITAMINS) TABS Take 1 tablet by mouth daily.   Yes [provider]  levothyroxine (SYNTHROID) 25 MCG tablet Take 25 mcg by mouth daily before breakfast. Patient not taking: Reported on 05/15/2022 01/05/22   [provider]   metoprolol succinate (TOPROL-XL) 50 MG 24 hr tablet Take 50 mg by mouth daily. Take with or immediately following a meal at 8pm.    [provider]      VITAL SIGNS:  Blood pressure (!) 106/48, pulse 79, temperature 99.1 F (37.3 C), temperature source Oral, resp. rate 20, height '5\' 3"'$  (1.6 m), weight 114.3 kg, SpO2 97 %.  PHYSICAL EXAMINATION:  Physical Exam  GENERAL:  66 y.o.-year-old Caucasian female patient lying in the bed with no acute distress.  EYES: Pupils equal, round, reactive to light and accommodation. No scleral icterus. Extraocular muscles intact.  HEENT: Head atraumatic, normocephalic. Oropharynx and nasopharynx clear.  NECK:  Supple, no jugular venous distention. No thyroid enlargement, no tenderness.  LUNGS: Mildly diminished bibasilar breath sounds with occasional rhonchi.  No use of accessory muscles of respiration.  CARDIOVASCULAR: Regular rate and rhythm, S1, S2 normal. No murmurs, rubs, or gallops.  ABDOMEN: Soft, nondistended, nontender. Bowel sounds present. No organomegaly or mass.  EXTREMITIES: No pedal edema, cyanosis, or clubbing.  NEUROLOGIC: Cranial nerves II through XII are intact. Muscle strength 5/5 in all extremities. Sensation intact. Gait not checked.  PSYCHIATRIC: The patient is alert and oriented x 3.  Normal affect and good eye contact. SKIN: No obvious rash, lesion, or ulcer.   LABORATORY PANEL:   CBC Recent Labs  Lab 05/14/22 2001  WBC 11.0*  HGB 13.4  HCT 42.6  PLT 308   ------------------------------------------------------------------------------------------------------------------  Chemistries  Recent Labs  Lab 05/14/22 2001  NA 137  K 4.1  CL 104  CO2 23  GLUCOSE 227*  BUN 11  CREATININE 0.82  CALCIUM 9.5   ------------------------------------------------------------------------------------------------------------------  Cardiac Enzymes No results for input(s): "TROPONINI" in the last 168  hours. ------------------------------------------------------------------------------------------------------------------  RADIOLOGY:  DG Chest 2 View  Result Date: 05/14/2022 CLINICAL DATA:  Shortness of breath EXAM: CHEST - 2 VIEW COMPARISON:  Chest x-ray 08/22/2010 FINDINGS: The heart size and mediastinal contours are within normal limits. Both lungs are clear. The visualized skeletal structures are unremarkable. IMPRESSION: No active cardiopulmonary disease. Electronically Signed   By: Ronney Asters M.D.   On: 05/14/2022 20:36      IMPRESSION AND PLAN:  Assessment and Plan: * Influenza A - The patient be admitted to a medical telemetry bed. - We will place her on p.o. Tamiflu. - She will be hydrated with IV normal saline. - Symptomatic therapy will be provided otherwise with mucolytic's.  Hypoxemia - O2 protocol will be followed. - We will taper off oxygen as tolerated.  Hypothyroidism - We will Continue Synthroid.  Type 2 diabetes mellitus without complications ([place]) - The will be placed on supplemental coverage with NovoLog. - We will hold off metformin and continue Glucotrol.  Dyslipidemia - We will continue statin therapy.   DVT prophylaxis: Lovenox.  Advanced Care Planning:  Code Status: full code.  Family Communication:  The plan of care was discussed in details with the patient (and family). I answered all questions. The patient agreed to proceed with the above mentioned plan. Further management will depend upon hospital course. Disposition Plan: Back to previous home environment Consults called: none.  All the records are reviewed and case discussed with ED provider.  Status is: Inpatient  At the time of the admission, it appears that the appropriate admission status for this patient is inpatient.  This is judged to be reasonable and necessary in order to provide the required intensity of service to ensure the patient's safety given the presenting symptoms,  physical exam findings and initial radiographic and laboratory data in the context of comorbid conditions.  The patient requires inpatient status due to high intensity of service, high risk of further deterioration and high frequency of surveillance required.  I certify that at the time of admission, it is my clinical judgment that the patient will require inpatient hospital care extending more than 2 midnights.                            Dispo: The patient is from: Home              Anticipated d/c is to: Home              Patient currently is not medically stable to d/c.  Difficult to place patient: No  Christel Mormon M.D on 05/15/2022 at 4:22 AM  [facility]   From 7 PM-7 AM, contact night-coverage [email or web address]  CC: Primary care physician; Juluis Pitch, MD

## 2022-05-15 NOTE — Assessment & Plan Note (Signed)
-   We will Continue Synthroid.

## 2022-05-15 NOTE — Final Progress Note (Addendum)
Same day rounding progress note  Patient seen and examined while in the ED.  Waiting for the floor bed.  Please see Dr. Randel Books dictated history and physical.  I agree with his assessment and plan.  Influenza A Patient is still quite symptomatic.  She shares her grandkids being sick with upper respite tract infection.  She was hypoxic in 80s at home Wean oxygen as able.  Husband was at bedside and was updated  Time spent 20 minutes

## 2022-05-15 NOTE — Assessment & Plan Note (Signed)
-   The patient be admitted to a medical telemetry bed. - We will place her on p.o. Tamiflu. - She will be hydrated with IV normal saline. - Symptomatic therapy will be provided otherwise with mucolytic's.

## 2022-05-15 NOTE — Plan of Care (Signed)

## 2022-05-15 NOTE — Progress Notes (Signed)
Anticoagulation monitoring(Lovenox):  66 yo female ordered Lovenox 40 mg Q24h    Filed Weights   05/14/22 1952  Weight: 114.3 kg (252 lb)   BMI 44.64   Lab Results  Component Value Date   CREATININE 0.82 05/14/2022   CREATININE 0.90 04/09/2017   CREATININE 0.75 12/10/2015   Estimated Creatinine Clearance: 84.5 mL/min (by C-G formula based on SCr of 0.82 mg/dL). Hemoglobin & Hematocrit     Component Value Date/Time   HGB 13.4 05/14/2022 2001   HCT 42.6 05/14/2022 2001     Per Protocol for Patient with estCrcl > 30 ml/min and BMI > 30, will transition to Lovenox 57.'5mg'$  Q24h.

## 2022-05-15 NOTE — Assessment & Plan Note (Signed)
-   O2 protocol will be followed. - We will taper off oxygen as tolerated.

## 2022-05-16 ENCOUNTER — Observation Stay: Payer: BC Managed Care – PPO

## 2022-05-16 DIAGNOSIS — J101 Influenza due to other identified influenza virus with other respiratory manifestations: Secondary | ICD-10-CM | POA: Diagnosis not present

## 2022-05-16 LAB — CBC
HCT: 37 % (ref 36.0–46.0)
Hemoglobin: 11.4 g/dL — ABNORMAL LOW (ref 12.0–15.0)
MCH: 28.8 pg (ref 26.0–34.0)
MCHC: 30.8 g/dL (ref 30.0–36.0)
MCV: 93.4 fL (ref 80.0–100.0)
Platelets: 259 10*3/uL (ref 150–400)
RBC: 3.96 MIL/uL (ref 3.87–5.11)
RDW: 14.6 % (ref 11.5–15.5)
WBC: 6.3 10*3/uL (ref 4.0–10.5)
nRBC: 0 % (ref 0.0–0.2)

## 2022-05-16 LAB — BASIC METABOLIC PANEL
Anion gap: 6 (ref 5–15)
BUN: 10 mg/dL (ref 8–23)
CO2: 24 mmol/L (ref 22–32)
Calcium: 8.2 mg/dL — ABNORMAL LOW (ref 8.9–10.3)
Chloride: 107 mmol/L (ref 98–111)
Creatinine, Ser: 0.83 mg/dL (ref 0.44–1.00)
GFR, Estimated: >60 mL/min (ref >60)
Glucose, Bld: 223 mg/dL — ABNORMAL HIGH (ref 70–99)
Potassium: 3.7 mmol/L (ref 3.5–5.1)
Sodium: 137 mmol/L (ref 135–145)

## 2022-05-16 LAB — PROCALCITONIN: Procalcitonin: <0.1 ng/mL

## 2022-05-16 LAB — GLUCOSE, CAPILLARY
Glucose-Capillary: 203 mg/dL — ABNORMAL HIGH (ref 70–99)
Glucose-Capillary: 137 mg/dL — ABNORMAL HIGH (ref 70–99)
Glucose-Capillary: 149 mg/dL — ABNORMAL HIGH (ref 70–99)
Glucose-Capillary: 194 mg/dL — ABNORMAL HIGH (ref 70–99)

## 2022-05-16 LAB — LACTIC ACID, PLASMA: Lactic Acid, Venous: 1.1 mmol/L (ref 0.5–1.9)

## 2022-05-16 MED ORDER — IOHEXOL 350 MG/ML SOLN
75.0000 mL | Freq: Once | INTRAVENOUS | Status: AC | PRN
  Administered 2022-05-16: 75 mL via INTRAVENOUS

## 2022-05-16 MED ORDER — IOHEXOL 300 MG/ML  SOLN: 75.0000 mL | Freq: Once | INTRAMUSCULAR | Status: DC | PRN

## 2022-05-16 MED ORDER — INSULIN GLARGINE-YFGN 100 UNIT/ML ~~LOC~~ SOLN
10.0000 [IU] | Freq: Every day | SUBCUTANEOUS | Status: DC
  Administered 2022-05-16 – 2022-05-18 (×3): 10 [IU] via SUBCUTANEOUS
  Filled 2022-05-16 (×3): qty 0.1

## 2022-05-16 NOTE — Progress Notes (Signed)
   05/16/22 0556  Assess: MEWS Score  Temp (!) 102.5 F (39.2 C)  BP (!) 149/66  MAP (mmHg) 87  Pulse Rate 98  Resp 18  SpO2 94 %  O2 Device Nasal Cannula  O2 Flow Rate (L/min) 2 L/min  Assess: MEWS Score  MEWS Temp 2  MEWS Systolic 0  MEWS Pulse 0  MEWS RR 0  MEWS LOC 0  MEWS Score 2  MEWS Score Color Yellow  Assess: if the MEWS score is Yellow or Red  Were vital signs taken at a resting state? Yes  Focused Assessment Change from prior assessment (see assessment flowsheet)  Does the patient meet 2 or more of the SIRS criteria? No  Does the patient have a confirmed or suspected source of infection? Yes  Provider and Rapid Response Notified? No  Treat  Pain Scale 0-10  Pain Score 0  Notify: Charge Nurse/RN  Name of Charge Nurse/RN Notified Butch Penny, RN  Date Charge Nurse/RN Notified 05/16/22  Time Charge Nurse/RN Notified [phone]  Provider Notification  Provider Name/Title Neomia Glass, NP  Date Provider Notified 05/16/22  Time Provider Notified [phone]  Method of Notification Call  Notification Reason Change in status  Provider response No new orders  Date of Provider Response 05/16/22  Time of Provider Response 0556  Assess: SIRS CRITERIA  SIRS Temperature  1  SIRS Pulse 1  SIRS Respirations  0  SIRS WBC 1  SIRS Score Sum  3

## 2022-05-16 NOTE — Progress Notes (Signed)
PROGRESS NOTE    SHELITHA Sosa  NFA:[id number] DOB: 1955-09-16 DOA: 05/14/2022 PCP: Juluis Pitch, MD      Brief Narrative:   From admission h and p  Diamond Sosa is a 66 y.o. Caucasian female with medical history significant for anxiety, osteoarthritis, type 2 diabetes mellitus, GERD, hypertension, hypothyroidism, IBS, urolithiasis, hypothyroidism and pituitary micro adenoma, who presented to the emergency room with acute onset of worsening dyspnea with associated cough productive of yellowish sputum over the last few days as well as fever and chills and generalized aching Mainly in the back.  She admits to headache with associated mild dizziness.  She has been having wheezing with her cough.  She admits to chest pain with her cough as well.  No dysuria, oliguria or hematuria or flank pain.  No paresthesias or focal muscle weakness. Patient stated whenever she lies flat her oxygen saturation drops down to 86% while awake.    Assessment & Plan:   Principal Problem:   Influenza A Active Problems:   Hypoxemia   Hypothyroidism   Type 2 diabetes mellitus without complications (HCC)   Essential hypertension   Dyslipidemia   Acute respiratory failure with hypoxia (HCC)  # Influenza A Symptomatic - continue tamiflu  # Acute hypoxic respiratory failure Dyspneic with minimal exertion, drops to mid 80s. - cont [place] O2 - CTA to eval for pulmonary complication  # Sepsis 2/2 flu, by fever, tachycardia. Hemodynamically stable. Requests to d/c iv fluids today - monitor off fluids  # T2DM Here gluocse moderately elevated to 200s - SSI - add semglee - hold home orals  # Hypothyroid - cont home synthroid   DVT prophylaxis: lovenox Code Status: full Family Communication: none @ bedside, offered to call husband, patient declined  Level of care: Telemetry Medical Status is: Inpatient Remains inpatient appropriate because: severity of illness    Consultants:   none  Procedures: none  Antimicrobials:  none    Subjective: Feeling very fatigued, dyspneic with ambulation  Objective: Vitals:   05/16/22 0449 05/16/22 0556 05/16/22 0607 05/16/22 0741  BP: (!) 104/48 (!) 149/66 (!) 145/54 (!) 118/50  Pulse: 84 98 95 92  Resp: '18 18  18  '$ Temp: (!) 102.5 F (39.2 C) (!) 102.5 F (39.2 C) (!) 102.4 F (39.1 C) 100.2 F (37.9 C)  TempSrc: Oral Oral Oral Oral  SpO2: 95% 94% 95% 94%  Weight:      Height:        Intake/Output Summary (Last 24 hours) at 05/16/2022 1014 Last data filed at 05/16/2022 0657 Gross per 24 hour  Intake 2000 ml  Output --  Net 2000 ml   Filed Weights   05/14/22 1952  Weight: 114.3 kg    Examination:  General exam: Appears calm and comfortable  Respiratory system: few rhonchi Cardiovascular system: S1 & S2 heard, RRR. No JVD, murmurs, rubs, gallops or clicks. No pedal edema. Gastrointestinal system: Abdomen is nondistended, soft and nontender. No organomegaly or masses felt. Normal bowel sounds heard. Central nervous system: Alert and oriented. No focal neurological deficits. Extremities: Symmetric 5 x 5 power. Skin: No rashes, lesions or ulcers Psychiatry: Judgement and insight appear normal. Mood & affect appropriate.     Data Reviewed: I have personally reviewed following labs and imaging studies  CBC: Recent Labs  Lab 05/14/22 2001 05/15/22 0452 05/16/22 0753  WBC 11.0* 7.3 6.3  HGB 13.4 12.8 11.4*  HCT 42.6 40.1 37.0  MCV 91.6 91.3 93.4  PLT 308 291  209   Basic Metabolic Panel: Recent Labs  Lab 05/14/22 2001 05/15/22 0452 05/16/22 0753  NA 137 138 137  K 4.1 3.8 3.7  CL 104 104 107  CO2 '23 26 24  '$ GLUCOSE 227* 234* 223*  BUN '11 10 10  '$ CREATININE 0.82 0.82 0.83  CALCIUM 9.5 9.3 8.2*   GFR: Estimated Creatinine Clearance: 83.5 mL/min (by C-G formula based on SCr of 0.83 mg/dL). Liver Function Tests: No results for input(s): "AST", "ALT", "ALKPHOS", "BILITOT", "PROT", "ALBUMIN"  in the last 168 hours. No results for input(s): "LIPASE", "AMYLASE" in the last 168 hours. No results for input(s): "AMMONIA" in the last 168 hours. Coagulation Profile: No results for input(s): "INR", "PROTIME" in the last 168 hours. Cardiac Enzymes: No results for input(s): "CKTOTAL", "CKMB", "CKMBINDEX", "TROPONINI" in the last 168 hours. BNP (last 3 results) No results for input(s): "PROBNP" in the last 8760 hours. HbA1C: No results for input(s): "HGBA1C" in the last 72 hours. CBG: Recent Labs  Lab 05/15/22 1809 05/15/22 2011 05/16/22 0742  GLUCAP 180* 255* 203*   Lipid Profile: No results for input(s): "CHOL", "HDL", "LDLCALC", "TRIG", "CHOLHDL", "LDLDIRECT" in the last 72 hours. Thyroid Function Tests: No results for input(s): "TSH", "T4TOTAL", "FREET4", "T3FREE", "THYROIDAB" in the last 72 hours. Anemia Panel: No results for input(s): "VITAMINB12", "FOLATE", "FERRITIN", "TIBC", "IRON", "RETICCTPCT" in the last 72 hours. Urine analysis:    Component Value Date/Time   COLORURINE YELLOW (A) 04/10/2017 0100   APPEARANCEUR Clear 04/17/2017 1323   LABSPEC 1.011 04/10/2017 0100   PHURINE 5.0 04/10/2017 0100   GLUCOSEU Negative 04/17/2017 1323   HGBUR MODERATE (A) 04/10/2017 0100   BILIRUBINUR Negative 04/17/2017 1323   KETONESUR NEGATIVE 04/10/2017 0100   PROTEINUR Negative 04/17/2017 1323   PROTEINUR NEGATIVE 04/10/2017 0100   NITRITE Negative 04/17/2017 1323   NITRITE NEGATIVE 04/10/2017 0100   LEUKOCYTESUR 1+ (A) 04/17/2017 1323   Sepsis Labs: '@LABRCNTIP'$ (procalcitonin:4,lacticidven:4)  ) Recent Results (from the past 240 hour(s))  Resp Panel by RT-PCR (Flu A&B, Covid) Anterior Nasal Swab     Status: Abnormal   Collection Time: 05/14/22  8:01 PM   Specimen: Anterior Nasal Swab  Result Value Ref Range Status   SARS Coronavirus 2 by RT PCR NEGATIVE NEGATIVE Final    Comment: (NOTE) SARS-CoV-2 target nucleic acids are NOT DETECTED.  The SARS-CoV-2 RNA is  generally detectable in upper respiratory specimens during the acute phase of infection. The lowest concentration of SARS-CoV-2 viral copies this assay can detect is 138 copies/mL. A negative result does not preclude SARS-Cov-2 infection and should not be used as the sole basis for treatment or other patient management decisions. A negative result may occur with  improper specimen collection/handling, submission of specimen other than nasopharyngeal swab, presence of viral mutation(s) within the areas targeted by this assay, and inadequate number of viral copies(<138 copies/mL). A negative result must be combined with clinical observations, patient history, and epidemiological information. The expected result is Negative.  Fact Sheet for Patients:  EntrepreneurPulse.com.au  Fact Sheet for Healthcare Providers:  IncredibleEmployment.be  This test is no t yet approved or cleared by the [place] FDA and  has been authorized for detection and/or diagnosis of SARS-CoV-2 by FDA under an Emergency Use Authorization (EUA). This EUA will remain  in effect (meaning this test can be used) for the duration of the COVID-19 declaration under Section 564(b)(1) of the Act, 21 U.S.C.section 360bbb-3(b)(1), unless the authorization is terminated  or revoked sooner.  Influenza A by PCR POSITIVE (A) NEGATIVE Final   Influenza B by PCR NEGATIVE NEGATIVE Final    Comment: (NOTE) The Xpert Xpress SARS-CoV-2/FLU/RSV plus assay is intended as an aid in the diagnosis of influenza from Nasopharyngeal swab specimens and should not be used as a sole basis for treatment. Nasal washings and aspirates are unacceptable for Xpert Xpress SARS-CoV-2/FLU/RSV testing.  Fact Sheet for Patients: EntrepreneurPulse.com.au  Fact Sheet for Healthcare Providers: IncredibleEmployment.be  This test is not yet approved or cleared by the  [place] FDA and has been authorized for detection and/or diagnosis of SARS-CoV-2 by FDA under an Emergency Use Authorization (EUA). This EUA will remain in effect (meaning this test can be used) for the duration of the COVID-19 declaration under Section 564(b)(1) of the Act, 21 U.S.C. section 360bbb-3(b)(1), unless the authorization is terminated or revoked.  Performed at [facility], [address] [place], [place] [address]          Radiology Studies: DG Chest 2 View  Result Date: 05/14/2022 CLINICAL DATA:  Shortness of breath EXAM: CHEST - 2 VIEW COMPARISON:  Chest x-ray 08/22/2010 FINDINGS: The heart size and mediastinal contours are within normal limits. Both lungs are clear. The visualized skeletal structures are unremarkable. IMPRESSION: No active cardiopulmonary disease. Electronically Signed   By: Ronney Asters M.D.   On: 05/14/2022 20:36        Scheduled Meds:  atorvastatin  20 mg Oral Daily   cholecalciferol  2,000 Units Oral QPC supper   enoxaparin (LOVENOX) injection  0.5 mg/kg Subcutaneous Q24H   glipiZIDE  10 mg Oral BID AC   guaiFENesin  600 mg Oral BID   lisinopril  5 mg Oral Daily   metoprolol succinate  50 mg Oral Daily   multivitamin with minerals  1 tablet Oral Daily   oseltamivir  75 mg Oral BID   Continuous Infusions:  sodium chloride 100 mL/hr at 05/16/22 0657     LOS: 1 day     Desma Maxim, MD [facility]   If 7PM-7AM, please contact night-coverage [email or web address] Password [email or web address] 05/16/2022, 10:14 AM

## 2022-05-16 NOTE — Progress Notes (Signed)
   05/15/22 2006  Assess: MEWS Score  Temp (!) 102.1 F (38.9 C)  BP (!) 129/57  MAP (mmHg) 72  Pulse Rate 98  Resp 19  Level of Consciousness Alert  SpO2 94 %  Assess: MEWS Score  MEWS Temp 2  MEWS Systolic 0  MEWS Pulse 0  MEWS RR 0  MEWS LOC 0  MEWS Score 2  MEWS Score Color Yellow  Assess: if the MEWS score is Yellow or Red  Were vital signs taken at a resting state? Yes  Focused Assessment No change from prior assessment  Does the patient meet 2 or more of the SIRS criteria? Yes  Does the patient have a confirmed or suspected source of infection? Yes  Provider and Rapid Response Notified? Yes  MEWS guidelines implemented *See Row Information* Yes  Treat  Pain Scale 0-10  Pain Score 0  Take Vital Signs  Increase Vital Sign Frequency  Yellow: Q 2hr X 2 then Q 4hr X 2, if remains yellow, continue Q 4hrs  Escalate  MEWS: Escalate Yellow: discuss with charge nurse/RN and consider discussing with provider and RRT  Notify: Charge Nurse/RN  Name of Charge Nurse/RN Notified Butch Penny, RN  Date Charge Nurse/RN Notified 05/15/22  Time Charge Nurse/RN Notified 2006  Provider Notification  Provider Name/Title Neomia Glass, NP  Date Provider Notified 05/15/22  Time Provider Notified 2006  Method of Notification Call (Secure chat)  Notification Reason Change in status  Provider response No new orders  Date of Provider Response 05/15/22  Time of Provider Response 2006  Assess: SIRS CRITERIA  SIRS Temperature  1  SIRS Pulse 1  SIRS Respirations  0  SIRS WBC 1  SIRS Score Sum  3

## 2022-05-16 NOTE — Plan of Care (Signed)

## 2022-05-16 NOTE — Inpatient Diabetes Management (Signed)
Inpatient Diabetes Program Recommendations  AACE/ADA: New Consensus Statement on Inpatient Glycemic Control   Target Ranges:  Prepandial:   less than 140 mg/dL      Peak postprandial:   less than 180 mg/dL (1-2 hours)      Critically ill patients:  140 - 180 mg/dL    Latest Reference Range & Units 05/15/22 18:09 05/15/22 20:11 05/16/22 07:42  Glucose-Capillary 70 - 99 mg/dL 180 (H) 255 (H) 203 (H)   Review of Glycemic Control  Diabetes history: DM2 Outpatient Diabetes medications: Glipizide 10 mg BID, Metformin XR 1000 mg BID Current orders for Inpatient glycemic control: Semglee 10 units daily  Inpatient Diabetes Program Recommendations:    Insulin: Please consider ordering Novolog 0-9 units TID with meals and Novolog 0-5 units QHS.  Thanks, Barnie Alderman, RN, MSN, [facility] Coordinator Inpatient Diabetes Program [phone] (Team Pager from 8am to [place])

## 2022-05-17 DIAGNOSIS — J101 Influenza due to other identified influenza virus with other respiratory manifestations: Secondary | ICD-10-CM | POA: Diagnosis not present

## 2022-05-17 LAB — BASIC METABOLIC PANEL
Anion gap: 5 (ref 5–15)
BUN: 10 mg/dL (ref 8–23)
CO2: 26 mmol/L (ref 22–32)
Calcium: 8 mg/dL — ABNORMAL LOW (ref 8.9–10.3)
Chloride: 105 mmol/L (ref 98–111)
Creatinine, Ser: 0.84 mg/dL (ref 0.44–1.00)
GFR, Estimated: >60 mL/min (ref >60)
Glucose, Bld: 210 mg/dL — ABNORMAL HIGH (ref 70–99)
Potassium: 3.9 mmol/L (ref 3.5–5.1)
Sodium: 136 mmol/L (ref 135–145)

## 2022-05-17 LAB — GLUCOSE, CAPILLARY
Glucose-Capillary: 186 mg/dL — ABNORMAL HIGH (ref 70–99)
Glucose-Capillary: 174 mg/dL — ABNORMAL HIGH (ref 70–99)
Glucose-Capillary: 206 mg/dL — ABNORMAL HIGH (ref 70–99)
Glucose-Capillary: 222 mg/dL — ABNORMAL HIGH (ref 70–99)

## 2022-05-17 MED ORDER — INSULIN ASPART 100 UNIT/ML IJ SOLN
0.0000 [IU] | Freq: Three times a day (TID) | INTRAMUSCULAR | Status: DC
  Administered 2022-05-17: 3 [IU] via SUBCUTANEOUS
  Administered 2022-05-18: 2 [IU] via SUBCUTANEOUS
  Filled 2022-05-17 (×2): qty 1

## 2022-05-17 MED ORDER — INSULIN ASPART 100 UNIT/ML IJ SOLN
0.0000 [IU] | Freq: Every day | INTRAMUSCULAR | Status: DC
  Administered 2022-05-17: 2 [IU] via SUBCUTANEOUS
  Filled 2022-05-17: qty 1

## 2022-05-17 NOTE — Progress Notes (Signed)
PROGRESS NOTE    Diamond Sosa  NIO:[id number] DOB: 01-20-1956 DOA: 05/14/2022 PCP: Juluis Pitch, MD      Brief Narrative:   From admission h and p  Diamond Sosa is a 66 y.o. Caucasian female with medical history significant for anxiety, osteoarthritis, type 2 diabetes mellitus, GERD, hypertension, hypothyroidism, IBS, urolithiasis, hypothyroidism and pituitary micro adenoma, who presented to the emergency room with acute onset of worsening dyspnea with associated cough productive of yellowish sputum over the last few days as well as fever and chills and generalized aching Mainly in the back.  She admits to headache with associated mild dizziness.  She has been having wheezing with her cough.  She admits to chest pain with her cough as well.  No dysuria, oliguria or hematuria or flank pain.  No paresthesias or focal muscle weakness. Patient stated whenever she lies flat her oxygen saturation drops down to 86% while awake.    Assessment & Plan:   Principal Problem:   Influenza A Active Problems:   Hypoxemia   Hypothyroidism   Type 2 diabetes mellitus without complications (HCC)   Essential hypertension   Dyslipidemia   Acute respiratory failure with hypoxia ([place])  # Influenza A Symptomatic, remains very dyspneic and weak with ambulation - continue tamiflu  # Acute hypoxic respiratory failure Dyspneic with minimal exertion, ambulated better with RN today but dropped to the mid 13s. CTA 12/6 no PE or signs pneumonia or other complication. Now weaned off O2 at rest - PT evaluation  # Sepsis 2/2 flu, by fever, tachycardia. Hemodynamically stable. Off fluids  # T2DM Here gluocse moderately elevated to 200s - start - added semglee - hold home orals  # Hypothyroid - cont home synthroid   DVT prophylaxis: lovenox Code Status: full Family Communication: none @ bedside, offered to call husband, patient declined  Level of care: Med-Surg Status is: Inpatient Remains  inpatient appropriate because: severity of illness    Consultants:  none  Procedures: none  Antimicrobials:  none    Subjective: Feeling very fatigued, dyspneic with ambulation. Thinks she's feeling a little better today  Objective: Vitals:   05/17/22 0102 05/17/22 0435 05/17/22 0746 05/17/22 1037  BP: (!) 159/54 (!) 128/38 (!) 103/54 (!) 123/51  Pulse: 88 83 82 84  Resp: '20 18 18   '$ Temp: 100.2 F (37.9 C) 98.6 F (37 C) 99.8 F (37.7 C)   TempSrc:  Oral Oral   SpO2: 94% 92% 92%   Weight:      Height:       No intake or output data in the 24 hours ending 05/17/22 1508  Filed Weights   05/14/22 1952  Weight: 114.3 kg    Examination:  General exam: Appears calm and comfortable  Respiratory system: few rhonchi Cardiovascular system: S1 & S2 heard, RRR. No JVD, murmurs, rubs, gallops or clicks. No pedal edema. Gastrointestinal system: Abdomen is nondistended, soft and nontender. No organomegaly or masses felt. Normal bowel sounds heard. Central nervous system: Alert and oriented. No focal neurological deficits. Extremities: Symmetric 5 x 5 power. Skin: No rashes, lesions or ulcers Psychiatry: Judgement and insight appear normal. Mood & affect appropriate.     Data Reviewed: I have personally reviewed following labs and imaging studies  CBC: Recent Labs  Lab 05/14/22 2001 05/15/22 0452 05/16/22 0753  WBC 11.0* 7.3 6.3  HGB 13.4 12.8 11.4*  HCT 42.6 40.1 37.0  MCV 91.6 91.3 93.4  PLT 308 291 818   Basic Metabolic  Panel: Recent Labs  Lab 05/14/22 2001 05/15/22 0452 05/16/22 0753 05/17/22 0611  NA 137 138 137 136  K 4.1 3.8 3.7 3.9  CL 104 104 107 105  CO2 '23 26 24 26  '$ GLUCOSE 227* 234* 223* 210*  BUN '11 10 10 10  '$ CREATININE 0.82 0.82 0.83 0.84  CALCIUM 9.5 9.3 8.2* 8.0*   GFR: Estimated Creatinine Clearance: 82.5 mL/min (by C-G formula based on SCr of 0.84 mg/dL). Liver Function Tests: No results for input(s): "AST", "ALT", "ALKPHOS",  "BILITOT", "PROT", "ALBUMIN" in the last 168 hours. No results for input(s): "LIPASE", "AMYLASE" in the last 168 hours. No results for input(s): "AMMONIA" in the last 168 hours. Coagulation Profile: No results for input(s): "INR", "PROTIME" in the last 168 hours. Cardiac Enzymes: No results for input(s): "CKTOTAL", "CKMB", "CKMBINDEX", "TROPONINI" in the last 168 hours. BNP (last 3 results) No results for input(s): "PROBNP" in the last 8760 hours. HbA1C: No results for input(s): "HGBA1C" in the last 72 hours. CBG: Recent Labs  Lab 05/16/22 1231 05/16/22 1638 05/16/22 2129 05/17/22 0748 05/17/22 1144  GLUCAP 137* 149* 194* 186* 174*   Lipid Profile: No results for input(s): "CHOL", "HDL", "LDLCALC", "TRIG", "CHOLHDL", "LDLDIRECT" in the last 72 hours. Thyroid Function Tests: No results for input(s): "TSH", "T4TOTAL", "FREET4", "T3FREE", "THYROIDAB" in the last 72 hours. Anemia Panel: No results for input(s): "VITAMINB12", "FOLATE", "FERRITIN", "TIBC", "IRON", "RETICCTPCT" in the last 72 hours. Urine analysis:    Component Value Date/Time   COLORURINE YELLOW (A) 04/10/2017 0100   APPEARANCEUR Clear 04/17/2017 1323   LABSPEC 1.011 04/10/2017 0100   PHURINE 5.0 04/10/2017 0100   GLUCOSEU Negative 04/17/2017 1323   HGBUR MODERATE (A) 04/10/2017 0100   BILIRUBINUR Negative 04/17/2017 1323   KETONESUR NEGATIVE 04/10/2017 0100   PROTEINUR Negative 04/17/2017 1323   PROTEINUR NEGATIVE 04/10/2017 0100   NITRITE Negative 04/17/2017 1323   NITRITE NEGATIVE 04/10/2017 0100   LEUKOCYTESUR 1+ (A) 04/17/2017 1323   Sepsis Labs: '@LABRCNTIP'$ (procalcitonin:4,lacticidven:4)  ) Recent Results (from the past 240 hour(s))  Resp Panel by RT-PCR (Flu A&B, Covid) Anterior Nasal Swab     Status: Abnormal   Collection Time: 05/14/22  8:01 PM   Specimen: Anterior Nasal Swab  Result Value Ref Range Status   SARS Coronavirus 2 by RT PCR NEGATIVE NEGATIVE Final    Comment: (NOTE) SARS-CoV-2  target nucleic acids are NOT DETECTED.  The SARS-CoV-2 RNA is generally detectable in upper respiratory specimens during the acute phase of infection. The lowest concentration of SARS-CoV-2 viral copies this assay can detect is 138 copies/mL. A negative result does not preclude SARS-Cov-2 infection and should not be used as the sole basis for treatment or other patient management decisions. A negative result may occur with  improper specimen collection/handling, submission of specimen other than nasopharyngeal swab, presence of viral mutation(s) within the areas targeted by this assay, and inadequate number of viral copies(<138 copies/mL). A negative result must be combined with clinical observations, patient history, and epidemiological information. The expected result is Negative.  Fact Sheet for Patients:  EntrepreneurPulse.com.au  Fact Sheet for Healthcare Providers:  IncredibleEmployment.be  This test is no t yet approved or cleared by the [place] FDA and  has been authorized for detection and/or diagnosis of SARS-CoV-2 by FDA under an Emergency Use Authorization (EUA). This EUA will remain  in effect (meaning this test can be used) for the duration of the COVID-19 declaration under Section 564(b)(1) of the Act, 21 U.S.C.section 360bbb-3(b)(1), unless the authorization  is terminated  or revoked sooner.       Influenza A by PCR POSITIVE (A) NEGATIVE Final   Influenza B by PCR NEGATIVE NEGATIVE Final    Comment: (NOTE) The Xpert Xpress SARS-CoV-2/FLU/RSV plus assay is intended as an aid in the diagnosis of influenza from Nasopharyngeal swab specimens and should not be used as a sole basis for treatment. Nasal washings and aspirates are unacceptable for Xpert Xpress SARS-CoV-2/FLU/RSV testing.  Fact Sheet for Patients: EntrepreneurPulse.com.au  Fact Sheet for Healthcare  Providers: IncredibleEmployment.be  This test is not yet approved or cleared by the [place] FDA and has been authorized for detection and/or diagnosis of SARS-CoV-2 by FDA under an Emergency Use Authorization (EUA). This EUA will remain in effect (meaning this test can be used) for the duration of the COVID-19 declaration under Section 564(b)(1) of the Act, 21 U.S.C. section 360bbb-3(b)(1), unless the authorization is terminated or revoked.  Performed at [facility], [address] [place], [place] [address]   Culture, blood (Routine X 2) w Reflex to ID Panel     Status: None (Preliminary result)   Collection Time: 05/16/22  7:53 AM   Specimen: BLOOD RIGHT ARM  Result Value Ref Range Status   Specimen Description BLOOD RIGHT ARM  Final   Special Requests   Final    BOTTLES DRAWN AEROBIC AND ANAEROBIC Blood Culture adequate volume   Culture   Final    NO GROWTH < 24 HOURS Performed at [facility], [address] [place], [place] [address]    Report Status PENDING  Incomplete  Culture, blood (Routine X 2) w Reflex to ID Panel     Status: None (Preliminary result)   Collection Time: 05/16/22  7:53 AM   Specimen: BLOOD LEFT ARM  Result Value Ref Range Status   Specimen Description BLOOD LEFT ARM  Final   Special Requests   Final    BOTTLES DRAWN AEROBIC AND ANAEROBIC Blood Culture adequate volume   Culture   Final    NO GROWTH < 24 HOURS Performed at [facility], [address] [place], [place] [address]    Report Status PENDING  Incomplete         Radiology Studies: CT Angio Chest Pulmonary Embolism (PE) W or WO Contrast  Result Date: 05/16/2022 CLINICAL DATA:  Dyspnea, hypoxia EXAM: CT ANGIOGRAPHY CHEST WITH CONTRAST TECHNIQUE: Multidetector CT imaging of the chest was performed using the standard protocol during bolus administration of intravenous contrast. Multiplanar CT image reconstructions and MIPs were  obtained to evaluate the vascular anatomy. RADIATION DOSE REDUCTION: This exam was performed according to the departmental dose-optimization program which includes automated exposure control, adjustment of the mA and/or kV according to patient size and/or use of iterative reconstruction technique. CONTRAST:  84m OMNIPAQUE IOHEXOL 350 MG/ML SOLN COMPARISON:  Chest radiograph done on 05/14/2022 FINDINGS: Cardiovascular: There is homogeneous enhancement in thoracic aorta. There are scattered calcifications in thoracic aorta. There are no intraluminal filling defects in pulmonary artery branches. There is ectasia of main pulmonary artery measuring 3.5 cm suggesting possible pulmonary arterial hypertension. Mediastinum/Nodes: There are few subcentimeter nodes in mediastinum suggesting possible benign reactive hyperplasia. Lungs/Pleura: There is no focal pulmonary consolidation. Small linear densities are seen in lingula and left lower lobe suggesting minimal scarring or subsegmental atelectasis. There is no pleural effusion or pneumothorax. Upper Abdomen: There is fatty infiltration in liver. There is 3.4 x 3.1 cm smooth marginated nodule in left adrenal. There is small area  of macroscopic fat within this lesion. This lesion has not changed significantly in comparison with previous studies dating as far back as 12/10/2015 suggesting benign process such as adenoma. Spleen measures 12.9 cm in maximum diameter. Musculoskeletal: No acute findings are seen. Review of the MIP images confirms the above findings. IMPRESSION: There is no evidence of pulmonary artery embolism. There is ectasia of main pulmonary artery suggesting possible pulmonary arterial hypertension. There is no evidence of thoracic aortic dissection. There is no focal pulmonary consolidation. Fatty liver. 3.4 cm smooth marginated low-density nodule in left adrenal has not changed suggesting possible benign process such as adenoma. Electronically Signed   By:  Elmer Picker M.D.   On: 05/16/2022 12:37        Scheduled Meds:  atorvastatin  20 mg Oral Daily   cholecalciferol  2,000 Units Oral QPC supper   enoxaparin (LOVENOX) injection  0.5 mg/kg Subcutaneous Q24H   guaiFENesin  600 mg Oral BID   insulin aspart  0-5 Units Subcutaneous QHS   insulin aspart  0-9 Units Subcutaneous TID WC   insulin glargine-yfgn  10 Units Subcutaneous Daily   metoprolol succinate  50 mg Oral Daily   multivitamin with minerals  1 tablet Oral Daily   oseltamivir  75 mg Oral BID   Continuous Infusions:     LOS: 1 day     Desma Maxim, MD [facility]   If 7PM-7AM, please contact night-coverage [email or web address] Password [email or web address] 05/17/2022, 3:08 PM

## 2022-05-17 NOTE — Progress Notes (Signed)
Inpatient Diabetes Program Recommendations  AACE/ADA: New Consensus Statement on Inpatient Glycemic Control (2015)  Target Ranges:  Prepandial:   less than 140 mg/dL      Peak postprandial:   less than 180 mg/dL (1-2 hours)      Critically ill patients:  140 - 180 mg/dL   Lab Results  Component Value Date   GLUCAP 174 (H) 05/17/2022    Review of Glycemic Control  Latest Reference Range & Units 05/17/22 07:48 05/17/22 11:44  Glucose-Capillary 70 - 99 mg/dL 186 (H) 174 (H)  Diabetes history: DM2 Outpatient Diabetes medications: Glipizide 10 mg BID, Metformin XR 1000 mg BID Current orders for Inpatient glycemic control: Semglee 10 units daily   Inpatient Diabetes Program Recommendations:     Insulin: Please consider ordering Novolog 0-9 units TID with meals and Novolog 0-5 units QHS.   Adah Perl, RN, BC-ADM Inpatient Diabetes Coordinator Pager [phone]  (8a-5p)

## 2022-05-17 NOTE — TOC Initial Note (Signed)
Transition of Care [facility]) - Initial/Assessment Note    Patient Details  Name: SARIAH HENKIN MRN: [id number] Date of Birth: 1956-03-16  Transition of Care [facility]) CM/SW Contact:    Laurena Slimmer, RN Phone Number: 05/17/2022, 12:19 PM  Clinical Narrative:                  Transition of Care (TOC) Screening Note   Patient Details  Name: DANICE DIPPOLITO Date of Birth: 1955-07-03   Transition of Care [facility]) CM/SW Contact:    Laurena Slimmer, RN Phone Number: 05/17/2022, 12:19 PM    Transition of Care Department [facility]) has reviewed patient and no TOC needs have been identified at this time. We will continue to monitor patient advancement through interdisciplinary progression rounds. If new patient transition needs arise, please place a TOC consult.          Patient Goals and CMS Choice        Expected Discharge Plan and Services                                                Prior Living Arrangements/Services                       Activities of Daily Living Home Assistive Devices/Equipment: None ADL Screening (condition at time of admission) Patient's cognitive ability adequate to safely complete daily activities?: Yes Is the patient deaf or have difficulty hearing?: No Does the patient have difficulty seeing, even when wearing glasses/contacts?: No Does the patient have difficulty concentrating, remembering, or making decisions?: No Patient able to express need for assistance with ADLs?: Yes Does the patient have difficulty dressing or bathing?: No Independently performs ADLs?: Yes (appropriate for developmental age) Does the patient have difficulty walking or climbing stairs?: No Weakness of Legs: None Weakness of Arms/Hands: None  Permission Sought/Granted                  Emotional Assessment              Admission diagnosis:  Influenza A [J10.1] Acute respiratory failure with hypoxia ([place]) [J96.01] Patient Active Problem List    Diagnosis Date Noted   Influenza A 05/15/2022   Hypoxemia 05/15/2022   Dyslipidemia 05/15/2022   Hypothyroidism 05/15/2022   Type 2 diabetes mellitus without complications ([place]) 73/41/9379   Acute respiratory failure with hypoxia ([place]) 05/15/2022   Kidney stone 11/29/2015   Adaptive colitis 11/19/2014   Type 2 diabetes mellitus ([place]) 02/01/2014   SINUSITIS- ACUTE-NOS 04/28/2007   HYPERLIPIDEMIA 03/25/2007   Essential hypertension 03/25/2007   VENOUS INSUFFICIENCY, CHRONIC 03/25/2007   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   GLAUCOMA 02/27/2007   ALLERGIC RHINITIS 02/27/2007   SPRAIN/STRAIN, KNEE/LEG NEC 12/18/2006   PCP:  Juluis Pitch, MD Pharmacy:   [facility] [address] [place] [address] [place] [address] Phone: [phone] Fax: [phone]  [id number] [address]Phone: [phone]: [phone]    Social Determinants of Health (SDOH) Interventions    Readmission Risk Interventions     No data to display

## 2022-05-18 ENCOUNTER — Inpatient Hospital Stay: Payer: BC Managed Care – PPO

## 2022-05-18 ENCOUNTER — Other Ambulatory Visit: Payer: Self-pay | Admitting: Obstetrics and Gynecology

## 2022-05-18 DIAGNOSIS — J101 Influenza due to other identified influenza virus with other respiratory manifestations: Secondary | ICD-10-CM | POA: Diagnosis not present

## 2022-05-18 LAB — BASIC METABOLIC PANEL
Anion gap: 6 (ref 5–15)
BUN: 9 mg/dL (ref 8–23)
CO2: 27 mmol/L (ref 22–32)
Calcium: 8.5 mg/dL — ABNORMAL LOW (ref 8.9–10.3)
Chloride: 108 mmol/L (ref 98–111)
Creatinine, Ser: 0.72 mg/dL (ref 0.44–1.00)
GFR, Estimated: >60 mL/min (ref >60)
Glucose, Bld: 172 mg/dL — ABNORMAL HIGH (ref 70–99)
Potassium: 3.4 mmol/L — ABNORMAL LOW (ref 3.5–5.1)
Sodium: 141 mmol/L (ref 135–145)

## 2022-05-18 LAB — CBC
HCT: 32.5 % — ABNORMAL LOW (ref 36.0–46.0)
Hemoglobin: 10.4 g/dL — ABNORMAL LOW (ref 12.0–15.0)
MCH: 29.5 pg (ref 26.0–34.0)
MCHC: 32 g/dL (ref 30.0–36.0)
MCV: 92.1 fL (ref 80.0–100.0)
Platelets: 212 10*3/uL (ref 150–400)
RBC: 3.53 MIL/uL — ABNORMAL LOW (ref 3.87–5.11)
RDW: 14 % (ref 11.5–15.5)
WBC: 5.2 10*3/uL (ref 4.0–10.5)
nRBC: 0 % (ref 0.0–0.2)

## 2022-05-18 LAB — GLUCOSE, CAPILLARY
Glucose-Capillary: 196 mg/dL — ABNORMAL HIGH (ref 70–99)
Glucose-Capillary: 204 mg/dL — ABNORMAL HIGH (ref 70–99)

## 2022-05-18 MED ORDER — GUAIFENESIN-DM 100-10 MG/5ML PO SYRP
5.0000 mL | ORAL_SOLUTION | ORAL | Status: DC | PRN
  Administered 2022-05-18: 5 mL via ORAL
  Filled 2022-05-18: qty 10

## 2022-05-18 MED ORDER — OSELTAMIVIR PHOSPHATE 75 MG PO CAPS: 75.0000 mg | ORAL_CAPSULE | Freq: Two times a day (BID) | ORAL | 0 refills | Status: DC

## 2022-05-18 NOTE — Progress Notes (Signed)
Patient attempting to find ride home, as her husband will not be able to get her until 1600

## 2022-05-18 NOTE — Discharge Summary (Signed)
Diamond Sosa JEH:[id number] DOB: 1955-07-25 DOA: 05/14/2022  PCP: Juluis Pitch, MD  Admit date: 05/14/2022 Discharge date: 05/18/2022  Time spent: 35 minutes  Recommendations for Outpatient Follow-up:  Pcp f/u     Discharge Diagnoses:  Principal Problem:   Influenza A Active Problems:   Hypoxemia   Hypothyroidism   Type 2 diabetes mellitus without complications ([place])   Essential hypertension   Dyslipidemia   Acute respiratory failure with hypoxia [facility])   Discharge Condition: improved  Diet recommendation: carb modified  Filed Weights   05/14/22 1952  Weight: 114.3 kg    History of present illness:  From admission h and p   Diamond Sosa is a 66 y.o. Caucasian female with medical history significant for anxiety, osteoarthritis, type 2 diabetes mellitus, GERD, hypertension, hypothyroidism, IBS, urolithiasis, hypothyroidism and pituitary micro adenoma, who presented to the emergency room with acute onset of worsening dyspnea with associated cough productive of yellowish sputum over the last few days as well as fever and chills and generalized aching Mainly in the back.  She admits to headache with associated mild dizziness.  She has been having wheezing with her cough.  She admits to chest pain with her cough as well.  No dysuria, oliguria or hematuria or flank pain.  No paresthesias or focal muscle weakness. Patient stated whenever she lies flat her oxygen saturation drops down to 86% while awake.    Hospital Course:   Patient presented with severe fatigue and hypoxic respiratory failure from influenza a. Initially treated with nasal cannula o2 which was weaned off. Given tamiflu for the influenza a. CTA negative for PE, no infiltrate to suspect superimposed bacterial pneumonia. Septic by fever and tachycardia, 2/2 this influenza, sepsis physiology resolved. Symptomatically improved, ambulated well with PT with no home health or o2 needs identified. Will discharge to  complete course tamiflu.  Procedures: none   Consultations: none  Discharge Exam: Vitals:   05/18/22 0846 05/18/22 0952  BP: 117/62   Pulse: 78 78  Resp: 16   Temp: 98 F (36.7 C)   SpO2: 95% 94%    General: NAD Cardiovascular: RRR Respiratory: few rhonchi  Discharge Instructions   Discharge Instructions     Diet - low sodium heart healthy   Complete by: As directed    Increase activity slowly   Complete by: As directed       Allergies as of 05/18/2022       Reactions   Bee Venom Shortness Of Breath   Adhesive [tape] Other (See Comments)   Whelp on skin.   Cefuroxime Axetil    REACTION: GI distress   Erythromycin Base Nausea Only   Extreme nausea        Medication List     STOP taking these medications    levothyroxine 25 MCG tablet Commonly known as: SYNTHROID       TAKE these medications    atorvastatin 20 MG tablet Commonly known as: LIPITOR Take 1 tablet by mouth daily.   cholecalciferol 1000 units tablet Commonly known as: VITAMIN D Take 2,000 Units by mouth daily after supper. Reported on 12/12/2015   glipiZIDE 10 MG tablet Commonly known as: GLUCOTROL Take 10 mg by mouth 2 (two) times daily before a meal.   lisinopril 5 MG tablet Commonly known as: ZESTRIL Take 1 tablet by mouth daily.   metFORMIN 500 MG 24 hr tablet Commonly known as: GLUCOPHAGE-XR Take 2 tablets by mouth 2 (two) times daily with a meal.  metoprolol succinate 50 MG 24 hr tablet Commonly known as: TOPROL-XL Take 50 mg by mouth daily. Take with or immediately following a meal at 8pm.   Multi-Vitamins Tabs Take 1 tablet by mouth daily.   oseltamivir 75 MG capsule Commonly known as: TAMIFLU Take 1 capsule (75 mg total) by mouth 2 (two) times daily.       Allergies  Allergen Reactions   Bee Venom Shortness Of Breath   Adhesive [Tape] Other (See Comments)    Whelp on skin.   Cefuroxime Axetil     REACTION: GI distress   Erythromycin Base Nausea  Only    Extreme nausea    Follow-up Information     Juluis Pitch, MD Follow up.   Specialty: Family Medicine Contact information: [place] [phone]                  The results of significant diagnostics from this hospitalization (including imaging, microbiology, ancillary and laboratory) are listed below for reference.    Significant Diagnostic Studies: DG Chest Port 1 View  Result Date: 05/18/2022 CLINICAL DATA:  Influenza a. EXAM: PORTABLE CHEST 1 VIEW COMPARISON:  Radiograph 05/14/2022 FINDINGS: Stable cardiac and mediastinal contours. Aortic atherosclerosis. New peripheral linear opacities within the mid lungs bilaterally. No large area of consolidation. No pleural effusion or pneumothorax. IMPRESSION: New peripheral linear opacities within the mid lungs bilaterally favored to represent atelectasis or infection. Electronically Signed   By: Lovey Newcomer M.D.   On: 05/18/2022 07:50   CT Angio Chest Pulmonary Embolism (PE) W or WO Contrast  Result Date: 05/16/2022 CLINICAL DATA:  Dyspnea, hypoxia EXAM: CT ANGIOGRAPHY CHEST WITH CONTRAST TECHNIQUE: Multidetector CT imaging of the chest was performed using the standard protocol during bolus administration of intravenous contrast. Multiplanar CT image reconstructions and MIPs were obtained to evaluate the vascular anatomy. RADIATION DOSE REDUCTION: This exam was performed according to the departmental dose-optimization program which includes automated exposure control, adjustment of the mA and/or kV according to patient size and/or use of iterative reconstruction technique. CONTRAST:  55m OMNIPAQUE IOHEXOL 350 MG/ML SOLN COMPARISON:  Chest radiograph done on 05/14/2022 FINDINGS: Cardiovascular: There is homogeneous enhancement in thoracic aorta. There are scattered calcifications in thoracic aorta. There are no intraluminal filling defects in pulmonary artery branches. There is ectasia of main  pulmonary artery measuring 3.5 cm suggesting possible pulmonary arterial hypertension. Mediastinum/Nodes: There are few subcentimeter nodes in mediastinum suggesting possible benign reactive hyperplasia. Lungs/Pleura: There is no focal pulmonary consolidation. Small linear densities are seen in lingula and left lower lobe suggesting minimal scarring or subsegmental atelectasis. There is no pleural effusion or pneumothorax. Upper Abdomen: There is fatty infiltration in liver. There is 3.4 x 3.1 cm smooth marginated nodule in left adrenal. There is small area of macroscopic fat within this lesion. This lesion has not changed significantly in comparison with previous studies dating as far back as 12/10/2015 suggesting benign process such as adenoma. Spleen measures 12.9 cm in maximum diameter. Musculoskeletal: No acute findings are seen. Review of the MIP images confirms the above findings. IMPRESSION: There is no evidence of pulmonary artery embolism. There is ectasia of main pulmonary artery suggesting possible pulmonary arterial hypertension. There is no evidence of thoracic aortic dissection. There is no focal pulmonary consolidation. Fatty liver. 3.4 cm smooth marginated low-density nodule in left adrenal has not changed suggesting possible benign process such as adenoma. Electronically Signed   By: PElmer PickerM.D.   On: 05/16/2022  12:37   DG Chest 2 View  Result Date: 05/14/2022 CLINICAL DATA:  Shortness of breath EXAM: CHEST - 2 VIEW COMPARISON:  Chest x-ray 08/22/2010 FINDINGS: The heart size and mediastinal contours are within normal limits. Both lungs are clear. The visualized skeletal structures are unremarkable. IMPRESSION: No active cardiopulmonary disease. Electronically Signed   By: Ronney Asters M.D.   On: 05/14/2022 20:36    Microbiology: Recent Results (from the past 240 hour(s))  Resp Panel by RT-PCR (Flu A&B, Covid) Anterior Nasal Swab     Status: Abnormal   Collection Time:  05/14/22  8:01 PM   Specimen: Anterior Nasal Swab  Result Value Ref Range Status   SARS Coronavirus 2 by RT PCR NEGATIVE NEGATIVE Final    Comment: (NOTE) SARS-CoV-2 target nucleic acids are NOT DETECTED.  The SARS-CoV-2 RNA is generally detectable in upper respiratory specimens during the acute phase of infection. The lowest concentration of SARS-CoV-2 viral copies this assay can detect is 138 copies/mL. A negative result does not preclude SARS-Cov-2 infection and should not be used as the sole basis for treatment or other patient management decisions. A negative result may occur with  improper specimen collection/handling, submission of specimen other than nasopharyngeal swab, presence of viral mutation(s) within the areas targeted by this assay, and inadequate number of viral copies(<138 copies/mL). A negative result must be combined with clinical observations, patient history, and epidemiological information. The expected result is Negative.  Fact Sheet for Patients:  EntrepreneurPulse.com.au  Fact Sheet for Healthcare Providers:  IncredibleEmployment.be  This test is no t yet approved or cleared by the [place] FDA and  has been authorized for detection and/or diagnosis of SARS-CoV-2 by FDA under an Emergency Use Authorization (EUA). This EUA will remain  in effect (meaning this test can be used) for the duration of the COVID-19 declaration under Section 564(b)(1) of the Act, 21 U.S.C.section 360bbb-3(b)(1), unless the authorization is terminated  or revoked sooner.       Influenza A by PCR POSITIVE (A) NEGATIVE Final   Influenza B by PCR NEGATIVE NEGATIVE Final    Comment: (NOTE) The Xpert Xpress SARS-CoV-2/FLU/RSV plus assay is intended as an aid in the diagnosis of influenza from Nasopharyngeal swab specimens and should not be used as a sole basis for treatment. Nasal washings and aspirates are unacceptable for Xpert Xpress  SARS-CoV-2/FLU/RSV testing.  Fact Sheet for Patients: EntrepreneurPulse.com.au  Fact Sheet for Healthcare Providers: IncredibleEmployment.be  This test is not yet approved or cleared by the [place] FDA and has been authorized for detection and/or diagnosis of SARS-CoV-2 by FDA under an Emergency Use Authorization (EUA). This EUA will remain in effect (meaning this test can be used) for the duration of the COVID-19 declaration under Section 564(b)(1) of the Act, 21 U.S.C. section 360bbb-3(b)(1), unless the authorization is terminated or revoked.  Performed at [facility], [address] [place], [place] [address]   Culture, blood (Routine X 2) w Reflex to ID Panel     Status: None (Preliminary result)   Collection Time: 05/16/22  7:53 AM   Specimen: BLOOD RIGHT ARM  Result Value Ref Range Status   Specimen Description BLOOD RIGHT ARM  Final   Special Requests   Final    BOTTLES DRAWN AEROBIC AND ANAEROBIC Blood Culture adequate volume   Culture   Final    NO GROWTH 2 DAYS Performed at [facility], [address] [place], [place] [address]    Report Status PENDING  Incomplete  Culture, blood (Routine X 2) w Reflex to ID Panel     Status: None (Preliminary result)   Collection Time: 05/16/22  7:53 AM   Specimen: BLOOD LEFT ARM  Result Value Ref Range Status   Specimen Description BLOOD LEFT ARM  Final   Special Requests   Final    BOTTLES DRAWN AEROBIC AND ANAEROBIC Blood Culture adequate volume   Culture   Final    NO GROWTH 2 DAYS Performed at [facility], [place], [place] [address]    Report Status PENDING  Incomplete     Labs: Basic Metabolic Panel: Recent Labs  Lab 05/14/22 2001 05/15/22 0452 05/16/22 0753 05/17/22 0611 05/18/22 0341  NA 137 138 137 136 141  K 4.1 3.8 3.7 3.9 3.4*  CL 104 104 107 105 108  CO2 '23 26 24 26 27  '$ GLUCOSE 227* 234* 223* 210* 172*  BUN '11  10 10 10 9  '$ CREATININE 0.82 0.82 0.83 0.84 0.72  CALCIUM 9.5 9.3 8.2* 8.0* 8.5*   Liver Function Tests: No results for input(s): "AST", "ALT", "ALKPHOS", "BILITOT", "PROT", "ALBUMIN" in the last 168 hours. No results for input(s): "LIPASE", "AMYLASE" in the last 168 hours. No results for input(s): "AMMONIA" in the last 168 hours. CBC: Recent Labs  Lab 05/14/22 2001 05/15/22 0452 05/16/22 0753 05/18/22 0341  WBC 11.0* 7.3 6.3 5.2  HGB 13.4 12.8 11.4* 10.4*  HCT 42.6 40.1 37.0 32.5*  MCV 91.6 91.3 93.4 92.1  PLT 308 291 259 212   Cardiac Enzymes: No results for input(s): "CKTOTAL", "CKMB", "CKMBINDEX", "TROPONINI" in the last 168 hours. BNP: BNP (last 3 results) No results for input(s): "BNP" in the last 8760 hours.  ProBNP (last 3 results) No results for input(s): "PROBNP" in the last 8760 hours.  CBG: Recent Labs  Lab 05/17/22 0748 05/17/22 1144 05/17/22 1609 05/17/22 2015 05/18/22 0848  GLUCAP 186* 174* 206* 222* 196*       Signed:  Desma Maxim MD.  [facility] 05/18/2022, 10:33 AM'

## 2022-05-18 NOTE — Plan of Care (Signed)

## 2022-05-18 NOTE — Evaluation (Signed)
Physical Therapy Evaluation Patient Details Name: Diamond Sosa MRN: [id number] DOB: 04-21-1956 Today's Date: 05/18/2022  History of Present Illness  Diamond Sosa is a 66 y.o. Caucasian female with medical history significant for anxiety, osteoarthritis, type 2 diabetes mellitus, GERD, hypertension, hypothyroidism, IBS, urolithiasis, hypothyroidism and pituitary micro adenoma, who presented to the emergency room with acute onset of worsening dyspnea with associated cough productive of yellowish sputum over the last few days as well as fever and chills and generalized aching Mainly in the back.  She admits to headache with associated mild dizziness.  She has been having wheezing with her cough.  She admits to chest pain with her cough as well.  No dysuria, oliguria or hematuria or flank pain.  No paresthesias or focal muscle weakness. Patient stated whenever she lies flat her oxygen saturation drops down to 86% while awake.  Clinical Impression  The pt presents this session with intermittent SOB and coughing. She reports that she has been ambulating in room and completing ADLs independently without LOB. Pt ambulated with PT x75' with O2 saturation remaining >92%. The pt is presenting with some LE edema and was encouraged to elevate LE, as pt has been sitting in dependent position x 2 hours. RN and MD notified of LE edema. No further skilled PT needs at this time; pt is safe to d/c home with intermittent family care. PT will sign-off, please re-consult if needs change.      Recommendations for follow up therapy are one component of a multi-disciplinary discharge planning process, led by the attending physician.  Recommendations may be updated based on patient status, additional functional criteria and insurance authorization.  Follow Up Recommendations No PT follow up      Assistance Recommended at Discharge None  Patient can return home with the following  Help with stairs or ramp for  entrance    Equipment Recommendations    Recommendations for Other Services       Functional Status Assessment Patient has had a recent decline in their functional status and demonstrates the ability to make significant improvements in function in a reasonable and predictable amount of time.     Precautions / Restrictions Precautions Precautions: None Restrictions Weight Bearing Restrictions: No      Mobility  Bed Mobility Overal bed mobility: Modified Independent             General bed mobility comments: Pt presenting with difficulty laying flat. HOB elevated for transitions in and out of bed.    Transfers Overall transfer level: Modified independent Equipment used: None                    Ambulation/Gait Ambulation/Gait assistance: Independent Gait Distance (Feet): 75 Feet Assistive device: None Gait Pattern/deviations: Drifts right/left          Stairs            Wheelchair Mobility    Modified Rankin (Stroke Patients Only)       Balance Overall balance assessment: Mild deficits observed, not formally tested                                           Pertinent Vitals/Pain Pain Assessment Pain Assessment: No/denies pain    Home Living Family/patient expects to be discharged to:: Private residence Living Arrangements: Spouse/significant other;Children;Other relatives Available Help at Discharge: Family;Available PRN/intermittently Type of Home:  House Home Access: Stairs to enter Entrance Stairs-Rails: Right Entrance Stairs-Number of Steps: 6   Home Layout: One level        Prior Function Prior Level of Function : Independent/Modified Independent                     Hand Dominance   Dominant Hand: Right    Extremity/Trunk Assessment   Upper Extremity Assessment Upper Extremity Assessment: Overall WFL for tasks assessed    Lower Extremity Assessment Lower Extremity Assessment: Overall WFL  for tasks assessed    Cervical / Trunk Assessment Cervical / Trunk Assessment: Normal  Communication      Cognition Arousal/Alertness: Awake/alert Behavior During Therapy: WFL for tasks assessed/performed Overall Cognitive Status: Within Functional Limits for tasks assessed                                          General Comments General comments (skin integrity, edema, etc.): Pt presenting with LE edema (+1 pitting on bilateral shins)    Exercises     Assessment/Plan    PT Assessment Patient does not need any further PT services  PT Problem List         PT Treatment Interventions      PT Goals (Current goals can be found in the Care Plan section)  Acute Rehab PT Goals Patient Stated Goal: Return home today PT Goal Formulation: With patient Time For Goal Achievement: 05/21/22 Potential to Achieve Goals: Good    Frequency       Co-evaluation               AM-PAC PT "6 Clicks" Mobility  Outcome Measure Help needed turning from your back to your side while in a flat bed without using bedrails?: None Help needed moving from lying on your back to sitting on the side of a flat bed without using bedrails?: None Help needed moving to and from a bed to a chair (including a wheelchair)?: None Help needed standing up from a chair using your arms (e.g., wheelchair or bedside chair)?: None Help needed to walk in hospital room?: None Help needed climbing 3-5 steps with a railing? : A Little 6 Click Score: 23    End of Session   Activity Tolerance: Patient tolerated treatment well Patient left: in bed Nurse Communication: Mobility status PT Visit Diagnosis: Unsteadiness on feet (R26.81)    Time: 3007-6226 PT Time Calculation (min) (ACUTE ONLY): 17 min   Charges:   PT Evaluation $PT Eval Low Complexity: 1 Low PT Treatments $Gait Training: 8-22 mins        10:01 AM, 05/18/22 Tinisha Etzkorn A. Saverio Danker PT, DPT Physical Therapist - [facility] A Kannan Proia 05/18/2022, 9:58 AM

## 2022-05-21 LAB — CULTURE, BLOOD (ROUTINE X 2)
Culture: NO GROWTH
Culture: NO GROWTH
Special Requests: ADEQUATE
Special Requests: ADEQUATE

## 2022-09-05 ENCOUNTER — Other Ambulatory Visit: Payer: Self-pay

## 2022-09-05 ENCOUNTER — Emergency Department: Payer: BC Managed Care – PPO

## 2022-09-05 ENCOUNTER — Observation Stay (HOSPITAL_COMMUNITY)
Admit: 2022-09-05 | Discharge: 2022-09-05 | Disposition: A | Discharge status: Home / Self Care | Payer: BC Managed Care – PPO | Attending: Cardiology | Admitting: Cardiology

## 2022-09-05 ENCOUNTER — Inpatient Hospital Stay
Admission: EM | Admit: 2022-09-05 | Discharge: 2022-09-07 | DRG: 202 | Disposition: A | Discharge status: Home / Self Care | Payer: BC Managed Care – PPO | Attending: Obstetrics and Gynecology | Admitting: Obstetrics and Gynecology

## 2022-09-05 DIAGNOSIS — I444 Left anterior fascicular block: Secondary | ICD-10-CM | POA: Diagnosis present

## 2022-09-05 DIAGNOSIS — J101 Influenza due to other identified influenza virus with other respiratory manifestations: Secondary | ICD-10-CM | POA: Diagnosis present

## 2022-09-05 DIAGNOSIS — Z8042 Family history of malignant neoplasm of prostate: Secondary | ICD-10-CM

## 2022-09-05 DIAGNOSIS — I517 Cardiomegaly: Secondary | ICD-10-CM

## 2022-09-05 DIAGNOSIS — I10 Essential (primary) hypertension: Secondary | ICD-10-CM | POA: Diagnosis not present

## 2022-09-05 DIAGNOSIS — Z87891 Personal history of nicotine dependence: Secondary | ICD-10-CM

## 2022-09-05 DIAGNOSIS — Z79899 Other long term (current) drug therapy: Secondary | ICD-10-CM

## 2022-09-05 DIAGNOSIS — E1169 Type 2 diabetes mellitus with other specified complication: Secondary | ICD-10-CM | POA: Diagnosis not present

## 2022-09-05 DIAGNOSIS — J351 Hypertrophy of tonsils: Secondary | ICD-10-CM | POA: Diagnosis present

## 2022-09-05 DIAGNOSIS — Z7984 Long term (current) use of oral hypoglycemic drugs: Secondary | ICD-10-CM

## 2022-09-05 DIAGNOSIS — E1165 Type 2 diabetes mellitus with hyperglycemia: Secondary | ICD-10-CM | POA: Diagnosis not present

## 2022-09-05 DIAGNOSIS — F419 Anxiety disorder, unspecified: Secondary | ICD-10-CM | POA: Diagnosis present

## 2022-09-05 DIAGNOSIS — R0902 Hypoxemia: Secondary | ICD-10-CM | POA: Diagnosis present

## 2022-09-05 DIAGNOSIS — I2489 Other forms of acute ischemic heart disease: Secondary | ICD-10-CM | POA: Diagnosis not present

## 2022-09-05 DIAGNOSIS — Z794 Long term (current) use of insulin: Secondary | ICD-10-CM

## 2022-09-05 DIAGNOSIS — I214 Non-ST elevation (NSTEMI) myocardial infarction: Secondary | ICD-10-CM

## 2022-09-05 DIAGNOSIS — Z1152 Encounter for screening for COVID-19: Secondary | ICD-10-CM

## 2022-09-05 DIAGNOSIS — Z87442 Personal history of urinary calculi: Secondary | ICD-10-CM

## 2022-09-05 DIAGNOSIS — Z91030 Bee allergy status: Secondary | ICD-10-CM

## 2022-09-05 DIAGNOSIS — K219 Gastro-esophageal reflux disease without esophagitis: Secondary | ICD-10-CM | POA: Diagnosis present

## 2022-09-05 DIAGNOSIS — J9601 Acute respiratory failure with hypoxia: Secondary | ICD-10-CM | POA: Diagnosis not present

## 2022-09-05 DIAGNOSIS — R0602 Shortness of breath: Principal | ICD-10-CM

## 2022-09-05 DIAGNOSIS — J209 Acute bronchitis, unspecified: Secondary | ICD-10-CM | POA: Diagnosis not present

## 2022-09-05 DIAGNOSIS — E785 Hyperlipidemia, unspecified: Secondary | ICD-10-CM | POA: Diagnosis present

## 2022-09-05 DIAGNOSIS — Z6841 Body Mass Index (BMI) 40.0 and over, adult: Secondary | ICD-10-CM

## 2022-09-05 DIAGNOSIS — M1990 Unspecified osteoarthritis, unspecified site: Secondary | ICD-10-CM | POA: Diagnosis present

## 2022-09-05 DIAGNOSIS — Z23 Encounter for immunization: Secondary | ICD-10-CM

## 2022-09-05 DIAGNOSIS — E039 Hypothyroidism, unspecified: Secondary | ICD-10-CM | POA: Diagnosis present

## 2022-09-05 DIAGNOSIS — Z7951 Long term (current) use of inhaled steroids: Secondary | ICD-10-CM

## 2022-09-05 DIAGNOSIS — Z881 Allergy status to other antibiotic agents status: Secondary | ICD-10-CM

## 2022-09-05 DIAGNOSIS — Z9049 Acquired absence of other specified parts of digestive tract: Secondary | ICD-10-CM

## 2022-09-05 DIAGNOSIS — T380X5A Adverse effect of glucocorticoids and synthetic analogues, initial encounter: Secondary | ICD-10-CM | POA: Diagnosis not present

## 2022-09-05 DIAGNOSIS — E119 Type 2 diabetes mellitus without complications: Secondary | ICD-10-CM

## 2022-09-05 DIAGNOSIS — Z91048 Other nonmedicinal substance allergy status: Secondary | ICD-10-CM

## 2022-09-05 DIAGNOSIS — K589 Irritable bowel syndrome without diarrhea: Secondary | ICD-10-CM | POA: Diagnosis present

## 2022-09-05 DIAGNOSIS — J40 Bronchitis, not specified as acute or chronic: Secondary | ICD-10-CM

## 2022-09-05 LAB — CBC WITH DIFFERENTIAL/PLATELET
Abs Immature Granulocytes: 0.06 10*3/uL (ref 0.00–0.07)
Basophils Absolute: 0.1 10*3/uL (ref 0.0–0.1)
Basophils Relative: 1 %
Eosinophils Absolute: 0.4 10*3/uL (ref 0.0–0.5)
Eosinophils Relative: 4 %
HCT: 41.6 % (ref 36.0–46.0)
Hemoglobin: 13.1 g/dL (ref 12.0–15.0)
Immature Granulocytes: 1 %
Lymphocytes Relative: 23 %
Lymphs Abs: 2.2 10*3/uL (ref 0.7–4.0)
MCH: 28.7 pg (ref 26.0–34.0)
MCHC: 31.5 g/dL (ref 30.0–36.0)
MCV: 91.2 fL (ref 80.0–100.0)
Monocytes Absolute: 0.5 10*3/uL (ref 0.1–1.0)
Monocytes Relative: 6 %
Neutro Abs: 6.4 10*3/uL (ref 1.7–7.7)
Neutrophils Relative %: 65 %
Platelets: 281 10*3/uL (ref 150–400)
RBC: 4.56 MIL/uL (ref 3.87–5.11)
RDW: 14.4 % (ref 11.5–15.5)
WBC: 9.6 10*3/uL (ref 4.0–10.5)
nRBC: 0 % (ref 0.0–0.2)

## 2022-09-05 LAB — COMPREHENSIVE METABOLIC PANEL
ALT: 32 U/L (ref 0–44)
AST: 28 U/L (ref 15–41)
Albumin: 3.9 g/dL (ref 3.5–5.0)
Alkaline Phosphatase: 74 U/L (ref 38–126)
Anion gap: 10 (ref 5–15)
BUN: 16 mg/dL (ref 8–23)
CO2: 24 mmol/L (ref 22–32)
Calcium: 9 mg/dL (ref 8.9–10.3)
Chloride: 104 mmol/L (ref 98–111)
Creatinine, Ser: 0.81 mg/dL (ref 0.44–1.00)
GFR, Estimated: >60 mL/min (ref >60)
Glucose, Bld: 280 mg/dL — ABNORMAL HIGH (ref 70–99)
Potassium: 4 mmol/L (ref 3.5–5.1)
Sodium: 138 mmol/L (ref 135–145)
Total Bilirubin: 0.4 mg/dL (ref 0.3–1.2)
Total Protein: 7.2 g/dL (ref 6.5–8.1)

## 2022-09-05 LAB — GLUCOSE, CAPILLARY
Glucose-Capillary: 418 mg/dL — ABNORMAL HIGH (ref 70–99)
Glucose-Capillary: 398 mg/dL — ABNORMAL HIGH (ref 70–99)
Glucose-Capillary: 359 mg/dL — ABNORMAL HIGH (ref 70–99)
Glucose-Capillary: 289 mg/dL — ABNORMAL HIGH (ref 70–99)

## 2022-09-05 LAB — ECHOCARDIOGRAM COMPLETE
AR max vel: 2.66 cm2
AV Area VTI: 3.33 cm2
AV Area mean vel: 3.2 cm2
AV Mean grad: 6 mmHg
AV Peak grad: 11.7 mmHg
Ao pk vel: 1.71 m/s
Area-P 1/2: 4.96 cm2
Height: 63 in
MV VTI: 4.13 cm2
Weight: 4000 oz

## 2022-09-05 LAB — LIPID PANEL
Cholesterol: 135 mg/dL (ref 0–200)
HDL: 35 mg/dL — ABNORMAL LOW (ref >40)
LDL Cholesterol: 75 mg/dL (ref 0–99)
Total CHOL/HDL Ratio: 3.9 RATIO
Triglycerides: 126 mg/dL (ref <150)
VLDL: 25 mg/dL (ref 0–40)

## 2022-09-05 LAB — RESPIRATORY PANEL BY PCR

## 2022-09-05 LAB — BRAIN NATRIURETIC PEPTIDE: B Natriuretic Peptide: 93.5 pg/mL (ref 0.0–100.0)

## 2022-09-05 LAB — TROPONIN I (HIGH SENSITIVITY)
Troponin I (High Sensitivity): 42 ng/L — ABNORMAL HIGH (ref <18)
Troponin I (High Sensitivity): 169 ng/L (ref <18)
Troponin I (High Sensitivity): 182 ng/L (ref <18)

## 2022-09-05 LAB — TSH: TSH: 8.845 u[IU]/mL — ABNORMAL HIGH (ref 0.350–4.500)

## 2022-09-05 LAB — GLUCOSE, RANDOM: Glucose, Bld: 381 mg/dL — ABNORMAL HIGH (ref 70–99)

## 2022-09-05 LAB — RESP PANEL BY RT-PCR (RSV, FLU A&B, COVID)  RVPGX2
Influenza A by PCR: NEGATIVE
Influenza B by PCR: NEGATIVE
Resp Syncytial Virus by PCR: NEGATIVE
SARS Coronavirus 2 by RT PCR: NEGATIVE

## 2022-09-05 LAB — GROUP A STREP BY PCR: Group A Strep by PCR: NOT DETECTED

## 2022-09-05 MED ORDER — INSULIN ASPART 100 UNIT/ML IJ SOLN: 0.0000 [IU] | Freq: Three times a day (TID) | INTRAMUSCULAR | Status: DC

## 2022-09-05 MED ORDER — INSULIN ASPART 100 UNIT/ML IJ SOLN
0.0000 [IU] | Freq: Every day | INTRAMUSCULAR | Status: DC
  Administered 2022-09-05: 3 [IU] via SUBCUTANEOUS
  Administered 2022-09-06: 2 [IU] via SUBCUTANEOUS
  Filled 2022-09-05 (×2): qty 1

## 2022-09-05 MED ORDER — RACEPINEPHRINE HCL 2.25 % IN NEBU: 0.5000 mL | INHALATION_SOLUTION | RESPIRATORY_TRACT | Status: DC | PRN

## 2022-09-05 MED ORDER — ENOXAPARIN SODIUM 40 MG/0.4ML IJ SOSY: 40.0000 mg | PREFILLED_SYRINGE | INTRAMUSCULAR | Status: DC

## 2022-09-05 MED ORDER — INSULIN ASPART 100 UNIT/ML IJ SOLN
0.0000 [IU] | Freq: Three times a day (TID) | INTRAMUSCULAR | Status: DC
  Administered 2022-09-05 (×3): 15 [IU] via SUBCUTANEOUS
  Administered 2022-09-06: 5 [IU] via SUBCUTANEOUS
  Administered 2022-09-06: 8 [IU] via SUBCUTANEOUS
  Filled 2022-09-05 (×5): qty 1

## 2022-09-05 MED ORDER — IPRATROPIUM-ALBUTEROL 0.5-2.5 (3) MG/3ML IN SOLN: 3.0000 mL | RESPIRATORY_TRACT | Status: DC | PRN

## 2022-09-05 MED ORDER — ENOXAPARIN SODIUM 60 MG/0.6ML IJ SOSY
0.5000 mg/kg | PREFILLED_SYRINGE | INTRAMUSCULAR | Status: DC
  Administered 2022-09-05 – 2022-09-07 (×3): 57.5 mg via SUBCUTANEOUS
  Filled 2022-09-05 (×3): qty 0.6

## 2022-09-05 MED ORDER — FUROSEMIDE 10 MG/ML IJ SOLN
20.0000 mg | Freq: Once | INTRAMUSCULAR | Status: AC
  Administered 2022-09-05: 20 mg via INTRAVENOUS
  Filled 2022-09-05: qty 4

## 2022-09-05 MED ORDER — IPRATROPIUM-ALBUTEROL 0.5-2.5 (3) MG/3ML IN SOLN
3.0000 mL | Freq: Once | RESPIRATORY_TRACT | Status: AC
  Administered 2022-09-05: 3 mL via RESPIRATORY_TRACT
  Filled 2022-09-05: qty 3

## 2022-09-05 MED ORDER — ADULT MULTIVITAMIN W/MINERALS CH
1.0000 | ORAL_TABLET | Freq: Every day | ORAL | Status: DC
  Administered 2022-09-05 – 2022-09-06 (×2): 1 via ORAL
  Filled 2022-09-05 (×3): qty 1

## 2022-09-05 MED ORDER — ASPIRIN 325 MG PO TBEC
325.0000 mg | DELAYED_RELEASE_TABLET | Freq: Every day | ORAL | Status: DC
  Administered 2022-09-05 – 2022-09-07 (×3): 325 mg via ORAL
  Filled 2022-09-05 (×3): qty 1

## 2022-09-05 MED ORDER — INSULIN GLARGINE-YFGN 100 UNIT/ML ~~LOC~~ SOLN
10.0000 [IU] | Freq: Every day | SUBCUTANEOUS | Status: DC
  Administered 2022-09-05: 10 [IU] via SUBCUTANEOUS
  Filled 2022-09-05 (×2): qty 0.1

## 2022-09-05 MED ORDER — LISINOPRIL 5 MG PO TABS
5.0000 mg | ORAL_TABLET | Freq: Every day | ORAL | Status: DC
  Administered 2022-09-05 – 2022-09-07 (×3): 5 mg via ORAL
  Filled 2022-09-05 (×3): qty 1

## 2022-09-05 MED ORDER — ONDANSETRON HCL 4 MG PO TABS: 4.0000 mg | ORAL_TABLET | Freq: Four times a day (QID) | ORAL | Status: DC | PRN

## 2022-09-05 MED ORDER — ONDANSETRON HCL 4 MG/2ML IJ SOLN: 4.0000 mg | Freq: Four times a day (QID) | INTRAMUSCULAR | Status: DC | PRN

## 2022-09-05 MED ORDER — SODIUM CHLORIDE 0.9 % IV BOLUS
500.0000 mL | Freq: Once | INTRAVENOUS | Status: AC
  Administered 2022-09-05: 500 mL via INTRAVENOUS

## 2022-09-05 MED ORDER — RACEPINEPHRINE HCL 2.25 % IN NEBU
0.5000 mL | INHALATION_SOLUTION | Freq: Once | RESPIRATORY_TRACT | Status: AC
  Administered 2022-09-05: 0.5 mL via RESPIRATORY_TRACT
  Filled 2022-09-05: qty 0.5

## 2022-09-05 MED ORDER — ATORVASTATIN CALCIUM 20 MG PO TABS
20.0000 mg | ORAL_TABLET | Freq: Every day | ORAL | Status: DC
  Administered 2022-09-05 – 2022-09-07 (×3): 20 mg via ORAL
  Filled 2022-09-05 (×3): qty 1

## 2022-09-05 MED ORDER — METOPROLOL SUCCINATE ER 50 MG PO TB24
50.0000 mg | ORAL_TABLET | Freq: Every day | ORAL | Status: DC
  Administered 2022-09-05 – 2022-09-07 (×3): 50 mg via ORAL
  Filled 2022-09-05 (×3): qty 1

## 2022-09-05 NOTE — ED Triage Notes (Signed)
Pt to room 10 via [facility] from home with dyspnea x 3 days. Woke at 2am today feeling like she couldn't get complete breaths. No home 02 requirement. Pt also c/o sore throat. MD Beather Arbour at bedside at this time.   125mg  Solumedrol 1 Duoneb 18G RAC en route

## 2022-09-05 NOTE — Assessment & Plan Note (Signed)
Trop 42--> 169 in setting of acute resp failure  No active CP  CXR w/ vascular congestion  Suspect heart strain in setting of viral bronchitis w/ some possible component of CHF given vascular congestion on CXR  Will add on BNP x 1 HEART Score 5+ given age, trop, RFs.  Will formally reach out to cardiology for consult.  Start ASA

## 2022-09-05 NOTE — Assessment & Plan Note (Signed)
SSI  A1C  Monitor for hyperglycemia in setting of steroid use.

## 2022-09-05 NOTE — ED Provider Notes (Signed)
[facility] Provider Note    Event Date/Time   First MD Initiated Contact with Patient 09/05/22 [phone]     (approximate)   History   Shortness of Breath   HPI  Tomasina B Waddy is a 67 y.o. female brought to the ED via EMS from home with a chief complaint of shortness of breath x 3 days.  Felt like she had sinus issues and was using her inhaler which she was prescribed in January after a bout with influenza.  Woke up at 2 AM feeling like she could not get complete breaths.  Does not wear oxygen at home at baseline.  Endorses sore throat.  EMS gave 1 DuoNeb and 125 mg IV Solu-Medrol and route.  Denies fever/chills, chest pain, abdominal pain, nausea, vomiting or dizziness.     Past Medical History   Past Medical History:  Diagnosis Date   Adrenal tumor    Anxiety    Arthritis    generalized   Cataracts, bilateral    Complication of anesthesia 2002   during galbladder surgery, pt was extubated in OR and did not breath, needed to be reintubated, pt remembers this and is concerned this will happen again.   Diabetes mellitus without complication (HCC)    Dysrhythmia    GERD (gastroesophageal reflux disease)    Heart murmur    Hypertension    Hypothyroidism    boarderline   IBS (irritable bowel syndrome)    Inflammatory bowel disease    Kidney stone    kidney stones   Microadenoma    Pituitary tumor    Shortness of breath dyspnea    Swelling of lower extremity    Thyroid disease    hypothyroid     Active Problem List   Patient Active Problem List   Diagnosis Date Noted   Acute bronchitis 09/05/2022   Influenza A 05/15/2022   Hypoxemia 05/15/2022   Dyslipidemia 05/15/2022   Hypothyroidism 05/15/2022   Type 2 diabetes mellitus without complications ([place]) [id number]   Acute respiratory failure with hypoxia ([place]) 05/15/2022   Kidney stone 11/29/2015   Adaptive colitis 11/19/2014   Type 2 diabetes mellitus ([place]) 02/01/2014   SINUSITIS-  ACUTE-NOS 04/28/2007   HYPERLIPIDEMIA 03/25/2007   Essential hypertension 03/25/2007   VENOUS INSUFFICIENCY, CHRONIC 03/25/2007   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   GLAUCOMA 02/27/2007   ALLERGIC RHINITIS 02/27/2007   SPRAIN/STRAIN, KNEE/LEG NEC 12/18/2006     Past Surgical History   Past Surgical History:  Procedure Laterality Date   CATARACT EXTRACTION W/PHACO Left 10/16/2017   Procedure: CATARACT EXTRACTION PHACO AND INTRAOCULAR LENS PLACEMENT ([place]) LEFT DIABETIC TORIC;  Surgeon: Leandrew Koyanagi, MD;  Location: [place];  Service: Ophthalmology;  Laterality: Left;   CERVICAL CERCLAGE     CHOLECYSTECTOMY     COLONOSCOPY WITH PROPOFOL N/A 09/13/2015   Procedure: COLONOSCOPY WITH PROPOFOL;  Surgeon: Lollie Sails, MD;  Location: [facility] ENDOSCOPY;  Service: Endoscopy;  Laterality: N/A;   NEPHROLITHOTOMY Left 11/29/2015   Procedure: NEPHROLITHOTOMY PERCUTANEOUS WITH URETERAL STENT PLACEMENT /BIOPSIES;  Surgeon: Hollice Espy, MD;  Location: [facility] ORS;  Service: Urology;  Laterality: Left;   TONSILLECTOMY     WISDOM TOOTH EXTRACTION Bilateral    all 4     Home Medications   Prior to Admission medications   Medication Sig Start Date End Date Taking? Authorizing Provider  atorvastatin (LIPITOR) 20 MG tablet Take 1 tablet by mouth daily.    [provider]  cholecalciferol (VITAMIN D) 1000  UNITS tablet Take 2,000 Units by mouth daily after supper. Reported on 12/12/2015    [provider]  glipiZIDE (GLUCOTROL) 10 MG tablet Take 10 mg by mouth 2 (two) times daily before a meal. 11/17/21   [provider]  lisinopril (ZESTRIL) 5 MG tablet Take 1 tablet by mouth daily. 11/29/21   [provider]  metFORMIN (GLUCOPHAGE-XR) 500 MG 24 hr tablet Take 2 tablets by mouth 2 (two) times daily with a meal. 12/13/21   [provider]  metoprolol succinate (TOPROL-XL) 50 MG 24 hr tablet Take 50 mg by mouth daily. Take with or immediately  following a meal at 8pm.    [provider]  Multiple Vitamin (MULTI-VITAMINS) TABS Take 1 tablet by mouth daily.    [provider]  oseltamivir (TAMIFLU) 75 MG capsule Take 1 capsule (75 mg total) by mouth 2 (two) times daily. 05/18/22   Wouk, Ailene Rud, MD     Allergies  Bee venom, Adhesive [tape], Cefuroxime axetil, and Erythromycin base   Family History   Family History  Problem Relation Age of Onset   Prostate cancer Father    Urolithiasis Mother    Kidney disease Neg Hx    Kidney cancer Neg Hx      Physical Exam  Triage Vital Signs: ED Triage Vitals  Enc Vitals Group     BP 09/05/22 0356 122/61     Pulse Rate 09/05/22 0356 100     Resp 09/05/22 0356 (!) 24     Temp 09/05/22 0356 97.7 F (36.5 C)     Temp Source 09/05/22 0356 Oral     SpO2 09/05/22 0356 96 %     Weight 09/05/22 0357 250 lb (113.4 kg)     Height 09/05/22 0357 5\' 3"  (1.6 m)     Head Circumference --      Peak Flow --      Pain Score 09/05/22 0356 0     Pain Loc --      Pain Edu? --      Excl. in [place]? --     Updated Vital Signs: BP (!) 144/87   Pulse 97   Temp 97.7 F (36.5 C) (Oral)   Resp 20   Ht 5\' 3"  (1.6 m)   Wt 113.4 kg   SpO2 97%   BMI 44.29 kg/m    General: Awake, mild to moderate distress.  CV:  RRR.  Good peripheral perfusion.  Resp:  Increased effort.  Hoarse sounding cough.  Expiratory wheeze.  Mild component of stridor. Abd:  Nontender.  No distention.  Other:  Oropharynx dull without tonsillar swelling, exudates or peritonsillar abscess.  Hoarse cough noted.  There is no muffled voice or drooling.  Tolerating secretions well.  Shotty anterior cervical lymphadenopathy.  Bilateral calves are supple and nontender.   ED Results / Procedures / Treatments  Labs (all labs ordered are listed, but only abnormal results are displayed) Labs Reviewed  COMPREHENSIVE METABOLIC PANEL - Abnormal; Notable for the following components:      Result Value   Glucose,  Bld 280 (*)    All other components within normal limits  TROPONIN I (HIGH SENSITIVITY) - Abnormal; Notable for the following components:   Troponin I (High Sensitivity) 42 (*)    All other components within normal limits  GROUP A STREP BY PCR  RESP PANEL BY RT-PCR (RSV, FLU A&B, COVID)  [id number]  CBC WITH DIFFERENTIAL/PLATELET  TROPONIN I (HIGH SENSITIVITY)  EKG  ED ECG REPORT I, Julieann Drummonds J, the attending physician, personally viewed and interpreted this ECG.   Date: 09/05/2022  EKG Time: 0420  Rate: 93  Rhythm: normal sinus rhythm  Axis: LAD  Intervals:left anterior fascicular block  ST&T Change: Nonspecific    RADIOLOGY I have independently visualized and interpreted patient's x-rays as well as noted the radiology interpretation:  Chest x-ray: Borderline vascular congestion  Soft tissue neck x-ray: Negative  Official radiology report(s): DG Neck Soft Tissue  Result Date: 09/05/2022 CLINICAL DATA:  Shortness of breath EXAM: NECK SOFT TISSUES - 1+ VIEW COMPARISON:  None Available. FINDINGS: There is no evidence of retropharyngeal soft tissue swelling or epiglottic enlargement. The cervical airway is unremarkable and no radio-opaque foreign body identified. IMPRESSION: Negative. Electronically Signed   By: Jorje Guild M.D.   On: 09/05/2022 04:46   DG Chest Port 1 View  Result Date: 09/05/2022 CLINICAL DATA:  Shortness of breath EXAM: PORTABLE CHEST 1 VIEW COMPARISON:  05/18/2022 FINDINGS: Enlarged heart size. Congested appearance of vessels. Linear scarring in the left mid lung which is stable. No effusion or pneumothorax. Artifact from EKG leads. IMPRESSION: Chronic cardiomegaly with borderline vascular congestion and mild left pulmonary scarring. No acute finding. Electronically Signed   By: Jorje Guild M.D.   On: 09/05/2022 04:46     PROCEDURES:  Critical Care performed: Yes, see critical care procedure note(s)  CRITICAL CARE Performed by: Paulette Blanch   Total critical care time: 30 minutes  Critical care time was exclusive of separately billable procedures and treating other patients.  Critical care was necessary to treat or prevent imminent or life-threatening deterioration.  Critical care was time spent personally by me on the following activities: development of treatment plan with patient and/or surrogate as well as nursing, discussions with consultants, evaluation of patient's response to treatment, examination of patient, obtaining history from patient or surrogate, ordering and performing treatments and interventions, ordering and review of laboratory studies, ordering and review of radiographic studies, pulse oximetry and re-evaluation of patient's condition.   [name]1-3 Lead EKG Interpretation  Performed by: Paulette Blanch, MD Authorized by: Paulette Blanch, MD     Interpretation: normal     ECG rate:  99   ECG rate assessment: normal     Rhythm: sinus rhythm     Ectopy: none     Conduction: normal   Comments:     Patient placed on cardiac monitor to evaluate for arrhythmias    MEDICATIONS ORDERED IN ED: Medications  Racepinephrine HCl 2.25 % nebulizer solution 0.5 mL (0.5 mLs Nebulization Given 09/05/22 0415)  sodium chloride 0.9 % bolus 500 mL (0 mLs Intravenous Stopped 09/05/22 0547)  ipratropium-albuterol (DUONEB) 0.5-2.5 (3) MG/3ML nebulizer solution 3 mL (3 mLs Nebulization Given 09/05/22 0538)     IMPRESSION / MDM / ASSESSMENT AND PLAN / ED COURSE  I reviewed the triage vital signs and the nursing notes.                             67 year old female presenting with shortness of breath. Differential includes, but is not limited to, viral syndrome, bronchitis including COPD exacerbation, pneumonia, reactive airway disease including asthma, CHF including exacerbation with or without pulmonary/interstitial edema, pneumothorax, ACS, thoracic trauma, and pulmonary embolism.  I personally reviewed patient's records and note a  PCP office visit following hospitalization for influenza on 05/29/2022.  Patient's presentation is most consistent with acute presentation  with potential threat to life or bodily function.  The patient is on the cardiac monitor to evaluate for evidence of arrhythmia and/or significant heart rate changes.  Obtain cardiac panel, chest x-ray.  Trial racemic epi neb for stridor heard on exam.  Will check soft tissue neck x-rays.  Will closely monitor and reassess.  Clinical Course as of 09/05/22 [id number]  Wed Sep 05, 2022  0527 Hoarse barky cough noted.  Patient feels like she needs another breathing treatment, will order DuoNeb.  Updated patient and spouse of laboratory and imaging results thus far.  Awaiting results of respiratory panel. [JS]    Clinical Course User Index [JS] Paulette Blanch, MD     FINAL CLINICAL IMPRESSION(S) / ED DIAGNOSES   Final diagnoses:  Shortness of breath  Bronchitis     Rx / DC Orders   ED Discharge Orders     None        Note:  This document was prepared using Dragon voice recognition software and may include unintentional dictation errors.   Paulette Blanch, MD 09/05/22 [phone]

## 2022-09-05 NOTE — TOC Progression Note (Signed)
Transition of Care [facility]) - Progression Note    Patient Details  Name: Diamond Sosa MRN: [id number] Date of Birth: 09/11/1955  [facility] CM/SW [place], RN Phone Number: 09/05/2022, 10:27 AM  Clinical Narrative:     The patient come from home where she resides with her spouse PCP is [place] is Freight forwarder with Cardiology for NSTEMI, Diabetic educator on board TOC to monitor for possible Home Oxygen needs  Expected Discharge Plan: Home/Self Care Barriers to Discharge: Continued Medical Work up  Expected Discharge Plan and Services   Discharge Planning Services: CM Consult   Living arrangements for the past 2 months: Single Family Home                                       Social Determinants of Health (SDOH) Interventions SDOH Screenings   Food Insecurity: No Food Insecurity (05/15/2022)  Housing: Low Risk  (05/15/2022)  Transportation Needs: No Transportation Needs (05/15/2022)  Utilities: Not At Risk (05/15/2022)  Tobacco Use: Medium Risk (09/05/2022)    Readmission Risk Interventions     No data to display

## 2022-09-05 NOTE — Consult Note (Signed)
Cardiology Consultation   Patient ID: Diamond Sosa MRN: [id number]; DOB: 1956/03/06  Admit date: 09/05/2022 Date of Consult: 09/05/2022  PCP:  Juluis Pitch, MD   [place] Providers Cardiologist:  None      New consult done by Dr. Fletcher Anon  Patient Profile:   Diamond Sosa is a 67 y.o. female with a hx of anxiety, osteoarthritis, type 2 diabetes, gastroesophageal reflux disease, hypertension, hypothyroidism, IBS, pituitary micro adenoma , former smoker, who is being seen 09/05/2022 for the evaluation of NSTEMI at the request of Dr. Ernestina Patches.  History of Present Illness:   Diamond Sosa is a 67 year old female with a previously mentioned past medical history.  She was recently hospitalized at [facility] and 05/2022 where she presented with acute onset of worsening dyspnea and associated cough productive of sputum over the last several days as well as a fever, chills, and generalized weakness.  She had headache and associated dizziness.  Patient stated at the time when she lied flat her oxygen saturation dropped into the 86 percentile.  She was initially to treated with nasal cannula and her oxygen was able to be weaned off, given Tamiflu after testing positive for flu A, CT of the chest was negative for PE but they suspected superimposed bacterial pneumonia and she will end up being septic by fever and tachycardia secondary to her influenza.  She was discharged on 05/18/2022 with complete course of Tamiflu completed.  She presented to the [facility] emergency department this morning via EMS from home with dyspnea x 3 days.  She states she woke at 2 AM feeling like she was unable to get a complete breath and was discharged previously without home oxygen.  EMS and given 1 DuoNeb and 125 mg of IV Solu-Medrol and route to the emergency department.  Her initial complaint was shortness of breath feeling like she had a sinus issues.  She stated that she had been using her inhaler but it had not helped  since she had influenza A at the end of December.  She states that she does have a cough and it sounds like she has croup.  She stated after her previous discharge she had return to her baseline and it was actually increasing her activity and walking daily to try to lose some of her weight.  She is concerned because she noticed that she had had swelling in her feet leg and hands and had previously followed up with her PCP asking for fluid PL but she stated she was told at that time she did not require one at this time.  She did require 2 L of O2 via nasal cannula in the emergency department to keep her oxygen saturations greater than 95%.  Respiratory panel was pending but her rapid strep returned negative.  Even though she denied any chest discomfort she did have elevated high-sensitivity troponins of 42 and 169.  There were no ischemic changes noted on EKG.  Cardiology was consulted for NSTEMI.  Initial vital signs: Blood pressure 122/61, pulse rate 100, respirations of 24, temperature of 97.7  Pertinent labs: High-sensitivity troponin 42 and 169, respiratory panel was negative, blood glucose of 280, RBCs of 3.53, hemoglobin 10.4, hematocrit of 32.5, TSH 8.845  Imaging: Chest x-ray revealed chronic cardiomegaly with borderline vascular congestion and mild left pulmonary scarring; soft tissue film of the neck was negative  Medications administered in the emergency department: Racemic epi nebulized, 500 mL bolus of normal saline, DuoNeb   Past Medical History:  Diagnosis Date   Adrenal tumor    Anxiety    Arthritis    generalized   Cataracts, bilateral    Complication of anesthesia 2002   during galbladder surgery, pt was extubated in OR and did not breath, needed to be reintubated, pt remembers this and is concerned this will happen again.   Diabetes mellitus without complication (HCC)    Dysrhythmia    GERD (gastroesophageal reflux disease)    Heart murmur    Hypertension    Hypothyroidism     boarderline   IBS (irritable bowel syndrome)    Inflammatory bowel disease    Kidney stone    kidney stones   Microadenoma    Pituitary tumor    Shortness of breath dyspnea    Swelling of lower extremity    Thyroid disease    hypothyroid    Past Surgical History:  Procedure Laterality Date   CATARACT EXTRACTION W/PHACO Left 10/16/2017   Procedure: CATARACT EXTRACTION PHACO AND INTRAOCULAR LENS PLACEMENT ([place]) LEFT DIABETIC TORIC;  Surgeon: Leandrew Koyanagi, MD;  Location: [place];  Service: Ophthalmology;  Laterality: Left;   CERVICAL CERCLAGE     CHOLECYSTECTOMY     COLONOSCOPY WITH PROPOFOL N/A 09/13/2015   Procedure: COLONOSCOPY WITH PROPOFOL;  Surgeon: Lollie Sails, MD;  Location: [facility] ENDOSCOPY;  Service: Endoscopy;  Laterality: N/A;   NEPHROLITHOTOMY Left 11/29/2015   Procedure: NEPHROLITHOTOMY PERCUTANEOUS WITH URETERAL STENT PLACEMENT /BIOPSIES;  Surgeon: Hollice Espy, MD;  Location: [facility] ORS;  Service: Urology;  Laterality: Left;   TONSILLECTOMY     WISDOM TOOTH EXTRACTION Bilateral    all 4     Home Medications:  Prior to Admission medications   Medication Sig Start Date End Date Taking? Authorizing Provider  WIXELA INHUB 250-50 MCG/ACT AEPB Inhale 1 puff into the lungs in the morning and at bedtime. 06/27/22  Yes [provider]  atorvastatin (LIPITOR) 20 MG tablet Take 1 tablet by mouth daily.    [provider]  cholecalciferol (VITAMIN D) 1000 UNITS tablet Take 2,000 Units by mouth daily after supper. Reported on 12/12/2015    [provider]  glipiZIDE (GLUCOTROL) 10 MG tablet Take 10 mg by mouth 2 (two) times daily before a meal. 11/17/21   [provider]  LANTUS SOLOSTAR 100 UNIT/ML Solostar Pen Inject into the skin.    [provider]  lisinopril (ZESTRIL) 5 MG tablet Take 1 tablet by mouth daily. 11/29/21   [provider]  metFORMIN (GLUCOPHAGE-XR) 500 MG 24 hr tablet Take 2 tablets by  mouth 2 (two) times daily with a meal. 12/13/21   [provider]  metoprolol succinate (TOPROL-XL) 50 MG 24 hr tablet Take 50 mg by mouth daily. Take with or immediately following a meal at 8pm.    [provider]  Multiple Vitamin (MULTI-VITAMINS) TABS Take 1 tablet by mouth daily.    [provider]  oseltamivir (TAMIFLU) 75 MG capsule Take 1 capsule (75 mg total) by mouth 2 (two) times daily. 05/18/22   Wouk, Ailene Rud, MD    Inpatient Medications: Scheduled Meds:  aspirin EC  325 mg Oral Daily   atorvastatin  20 mg Oral Daily   enoxaparin (LOVENOX) injection  0.5 mg/kg Subcutaneous Q24H   furosemide  20 mg Intravenous Once   insulin aspart  0-15 Units Subcutaneous TID WC   insulin aspart  0-5 Units Subcutaneous QHS   lisinopril  5 mg Oral Daily   metoprolol succinate  50 mg  Oral Daily   multivitamin with minerals  1 tablet Oral Daily   Continuous Infusions:  PRN Meds: ipratropium-albuterol, ondansetron **OR** ondansetron (ZOFRAN) IV, Racepinephrine HCl  Allergies:    Allergies  Allergen Reactions   Bee Venom Shortness Of Breath   Adhesive [Tape] Other (See Comments)    Whelp on skin.   Cefuroxime Axetil     REACTION: GI distress   Erythromycin Base Nausea Only    Extreme nausea    Social History:   Social History   Socioeconomic History   Marital status: Married    Spouse name: Not on file   Number of children: Not on file   Years of education: Not on file   Highest education level: Not on file  Occupational History   Not on file  Tobacco Use   Smoking status: Former    Types: Cigarettes    Quit date: 07/31/2000    Years since quitting: 22.1   Smokeless tobacco: Never  Substance and Sexual Activity   Alcohol use: Yes    Alcohol/week: 0.0 standard drinks of alcohol    Comment: occasional   Drug use: No   Sexual activity: Yes    Birth control/protection: Post-menopausal  Other Topics Concern   Not on file  Social History  Narrative   Not on file   Social Determinants of Health   Financial Resource Strain: Not on file  Food Insecurity: No Food Insecurity (05/15/2022)   Hunger Vital Sign    Worried About Running Out of Food in the Last Year: Never true    Ran Out of Food in the Last Year: Never true  Transportation Needs: No Transportation Needs (05/15/2022)   PRAPARE - Hydrologist (Medical): No    Lack of Transportation (Non-Medical): No  Physical Activity: Not on file  Stress: Not on file  Social Connections: Not on file  Intimate Partner Violence: Not At Risk (05/15/2022)   Humiliation, Afraid, Rape, and Kick questionnaire    Fear of Current or Ex-Partner: No    Emotionally Abused: No    Physically Abused: No    Sexually Abused: No    Family History:    Family History  Problem Relation Age of Onset   Prostate cancer Father    Urolithiasis Mother    Kidney disease Neg Hx    Kidney cancer Neg Hx      ROS:  Please see the history of present illness.  Review of Systems  Constitutional:  Positive for malaise/fatigue.  Respiratory:  Positive for cough, shortness of breath and wheezing.   Cardiovascular:  Positive for leg swelling.  Neurological:  Positive for weakness.    All other ROS reviewed and negative.     Physical Exam/Data:   Vitals:   09/05/22 0400 09/05/22 0618 09/05/22 0618 09/05/22 0654  BP: (!) 149/94 (!) 144/87  127/65  Pulse: 97   99  Resp:   20 20  Temp:    97.7 F (36.5 C)  TempSrc:      SpO2: 97%   98%  Weight:      Height:        Intake/Output Summary (Last 24 hours) at 09/05/2022 0809 Last data filed at 09/05/2022 0547 Gross per 24 hour  Intake 500 ml  Output --  Net 500 ml      09/05/2022    3:57 AM 05/14/2022    7:52 PM 11/06/2017    2:06 PM  Last 3 Weights  Weight (lbs)  250 lb 252 lb 250 lb  Weight (kg) 113.399 kg 114.306 kg 113.399 kg     Body mass index is 44.29 kg/m.  General:  Well nourished, well developed, in no  acute distress HEENT: normal Neck: Unable to assess JVD due to soft tissue swelling Vascular: No carotid bruits; Distal pulses 2+ bilaterally Cardiac:  normal S1, S2; RRR; no murmur  Lungs:  clear upper lobes with some crackles noted at the bases to auscultation bilaterally, + wheezing, accompanied with upper airway noise, she currently is on room air but becomes tachypneic with conversation Abd: soft, nontender, obese, no hepatomegaly  Ext: trace pretibial edema to BLE Musculoskeletal:  No deformities, BUE and BLE strength normal and equal Skin: warm and dry  Neuro:  CNs 2-12 intact, no focal abnormalities noted Psych:  Normal affect   EKG:  The EKG was personally reviewed and demonstrates: Sinus rhythm with a rate of 93, unifocal PVCs, left anterior fascicular block Telemetry:  Telemetry was personally reviewed and demonstrates: Sinus rhythm to sinus tach with rates of 90-100 with unifocal PVCs  Relevant CV Studies: Echocardiogram ordered and pending  Laboratory Data:  High Sensitivity Troponin:   Recent Labs  Lab 09/05/22 0400 09/05/22 0545  TROPONINIHS 42* 169*     Chemistry Recent Labs  Lab 09/05/22 0400  NA 138  K 4.0  CL 104  CO2 24  GLUCOSE 280*  BUN 16  CREATININE 0.81  CALCIUM 9.0  GFRNONAA >60  ANIONGAP 10    Recent Labs  Lab 09/05/22 0400  PROT 7.2  ALBUMIN 3.9  AST 28  ALT 32  ALKPHOS 74  BILITOT 0.4   Lipids No results for input(s): "CHOL", "TRIG", "HDL", "LABVLDL", "LDLCALC", "CHOLHDL" in the last 168 hours.  Hematology Recent Labs  Lab 09/05/22 0400  WBC 9.6  RBC 4.56  HGB 13.1  HCT 41.6  MCV 91.2  MCH 28.7  MCHC 31.5  RDW 14.4  PLT 281   Thyroid No results for input(s): "TSH", "FREET4" in the last 168 hours.  BNPNo results for input(s): "BNP", "PROBNP" in the last 168 hours.  DDimer No results for input(s): "DDIMER" in the last 168 hours.   Radiology/Studies:  DG Neck Soft Tissue  Result Date: 09/05/2022 CLINICAL DATA:   Shortness of breath EXAM: NECK SOFT TISSUES - 1+ VIEW COMPARISON:  None Available. FINDINGS: There is no evidence of retropharyngeal soft tissue swelling or epiglottic enlargement. The cervical airway is unremarkable and no radio-opaque foreign body identified. IMPRESSION: Negative. Electronically Signed   By: Jorje Guild M.D.   On: 09/05/2022 04:46   DG Chest Port 1 View  Result Date: 09/05/2022 CLINICAL DATA:  Shortness of breath EXAM: PORTABLE CHEST 1 VIEW COMPARISON:  05/18/2022 FINDINGS: Enlarged heart size. Congested appearance of vessels. Linear scarring in the left mid lung which is stable. No effusion or pneumothorax. Artifact from EKG leads. IMPRESSION: Chronic cardiomegaly with borderline vascular congestion and mild left pulmonary scarring. No acute finding. Electronically Signed   By: Jorje Guild M.D.   On: 09/05/2022 04:46     Assessment and Plan:   Elevated high-sensitivity troponin/NSTEMI -Continues to deny chest discomfort -High-sensitivity troponins trended 42 and 169 -Continue to trend high-sensitivity troponins until decline -Likely demand/supply mismatch due to acute respiratory failure with hypoxia -Echocardiogram ordered and pending, further recommendations to follow -Continued on aspirin -Continued on statin therapy -Continue telemetry monitoring -EKG as needed for pain or changes  Acute respiratory failure with hypoxia -Presented with complaints of shortness of  breath -Required 2 L of oxygen to maintain oxygen saturations greater than equal to 92% -Respiratory panel negative -RSV pending -BNP pending -Management per IM  Hypertension -Blood pressure 127/65 -Continue on lisinopril and Toprol-XL -Vital signs per unit protocol  Dyslipidemia -Lipid panel pending -Continue statin therapy  Hypothyroidism -TSH 8.845 -Previous admission levothyroxine was discontinued -Management per IM  Type 2 diabetes -Continue on insulin -Management per  IM   Risk Assessment/Risk Scores:     TIMI Risk Score for Unstable Angina or Non-ST Elevation MI:   The patient's TIMI risk score is  , which indicates a  % risk of all cause mortality, new or recurrent myocardial infarction or need for urgent revascularization in the next 14 days.  New York Heart Association (NYHA) Functional Class NYHA Class II        For questions or updates, please contact [place] Please consult [email or web address] for contact info under    Signed, Sorin Frimpong, NP  09/05/2022 8:09 AM

## 2022-09-05 NOTE — Progress Notes (Signed)
CBG 418, stat glucose verification ordered. Dr. Ernestina Patches notified.

## 2022-09-05 NOTE — Assessment & Plan Note (Signed)
Cont statin  Check lipid panel

## 2022-09-05 NOTE — Assessment & Plan Note (Signed)
BP stable Titrate home regimen 

## 2022-09-05 NOTE — Progress Notes (Signed)
PHARMACIST - PHYSICIAN COMMUNICATION  CONCERNING:  Enoxaparin (Lovenox) for DVT Prophylaxis    RECOMMENDATION: Patient was prescribed enoxaprin 40mg  q24 hours for VTE prophylaxis.   Filed Weights   09/05/22 0357  Weight: 113.4 kg (250 lb)    Body mass index is 44.29 kg/m.  Estimated Creatinine Clearance: 85.1 mL/min (by C-G formula based on SCr of 0.81 mg/dL).   Based on [place] patient is candidate for enoxaparin 0.5mg /kg TBW SQ every 24 hours based on BMI being >30.   DESCRIPTION: Pharmacy has adjusted enoxaparin dose per [facility] policy.  Patient is now receiving enoxaparin 57.5 mg every 24 hours   Pernell Dupre, PharmD, BCPS Clinical Pharmacist 09/05/2022 7:51 AM

## 2022-09-05 NOTE — Assessment & Plan Note (Signed)
Currently not on medication  Check TSH

## 2022-09-05 NOTE — Inpatient Diabetes Management (Signed)
Inpatient Diabetes Program Recommendations  AACE/ADA: New Consensus Statement on Inpatient Glycemic Control   Target Ranges:  Prepandial:   less than 140 mg/dL      Peak postprandial:   less than 180 mg/dL (1-2 hours)      Critically ill patients:  140 - 180 mg/dL    Latest Reference Range & Units 09/05/22 08:06  Glucose-Capillary 70 - 99 mg/dL 418 (H)    Latest Reference Range & Units 09/05/22 04:00 09/05/22 08:45  Glucose 70 - 99 mg/dL 280 (H) 381 (H)   Review of Glycemic Control  Diabetes history: DM2 Outpatient Diabetes medications: Glipizide 10 mg BID, Metformin XR 1000 mg BID Current orders for Inpatient glycemic control: Novolog 0-15 units TID with meals, Novolog 0-5 units QHS  Inpatient Diabetes Program Recommendations:    Insulin: CBG 418 mg/dl this morning.  Please consider ordering Semglee 10 units Q24H to start now.  HbgA1C:Noted in Care Everywhere that A1C was 8.6% on 05/29/22. Current A1C in process.   Thanks, Barnie Alderman, RN, MSN, [place] Diabetes Coordinator Inpatient Diabetes Program [phone] (Team Pager from 8am to [place])

## 2022-09-05 NOTE — Progress Notes (Signed)
*  PRELIMINARY RESULTS* Echocardiogram 2D Echocardiogram has been performed.  Diamond Sosa 09/05/2022, 1:35 PM

## 2022-09-05 NOTE — H&P (Signed)
History and Physical    Patient: Diamond Sosa [id number] DOB: [birth year] DOA: 09/05/2022 DOS: the patient was seen and examined on 09/05/2022 PCP: Diamond Pitch, MD  Patient coming from: Home  Chief Complaint:  Chief Complaint  Patient presents with   Shortness of Breath   HPI: Diamond Sosa is a 67 y.o. female with medical history significant of anxiety, osteoarthritis, type 2 diabetes mellitus, GERD, hypertension, hypothyroidism, IBS presenting w/ acute resp failure w/ hypoxia, NSTEMI. Pt reports increased WOB over the past 2-3 days. + rhinorrhea, nasal congestion. + sick contact in daughter w/ similar sxs. Noted prior 20+ pack years smoking history. No formal diagnosis of COPD/Asthma in the past. No fevers or chills. + recurrent cough. Mild wheezing. Noted LE swelling w/ orthopnea. Pt denies any CP. No hemiparesis or confusion.  Presented to ER afebrile, hemodynamically stable. Requiring 2 L nasal cannula to keep O2 sats greater than 95%.  COVID flu and RSV are pending.  Rapid strep negative.  Chest x-ray with borderline cardiomegaly and vascular congestion.  There was some concern for stridor, so soft tissues of the neck are obtained that were grossly stable.  Given IV Solu-Medrol, DuoNebs and racemic epinephrine with mild improvement in symptoms.  Troponin 42-->169.  EKG stable.     Review of Systems: As mentioned in the history of present illness. All other systems reviewed and are negative. Past Medical History:  Diagnosis Date   Adrenal tumor    Anxiety    Arthritis    generalized   Cataracts, bilateral    Complication of anesthesia 2002   during galbladder surgery, pt was extubated in OR and did not breath, needed to be reintubated, pt remembers this and is concerned this will happen again.   Diabetes mellitus without complication (HCC)    Dysrhythmia    GERD (gastroesophageal reflux disease)    Heart murmur    Hypertension    Hypothyroidism    boarderline   IBS  (irritable bowel syndrome)    Inflammatory bowel disease    Kidney stone    kidney stones   Microadenoma    Pituitary tumor    Shortness of breath dyspnea    Swelling of lower extremity    Thyroid disease    hypothyroid   Past Surgical History:  Procedure Laterality Date   CATARACT EXTRACTION W/PHACO Left 10/16/2017   Procedure: CATARACT EXTRACTION PHACO AND INTRAOCULAR LENS PLACEMENT ([place]) LEFT DIABETIC TORIC;  Surgeon: Leandrew Koyanagi, MD;  Location: [facility];  Service: Ophthalmology;  Laterality: Left;   CERVICAL CERCLAGE     CHOLECYSTECTOMY     COLONOSCOPY WITH PROPOFOL N/A 09/13/2015   Procedure: COLONOSCOPY WITH PROPOFOL;  Surgeon: Lollie Sails, MD;  Location: [facility] ENDOSCOPY;  Service: Endoscopy;  Laterality: N/A;   NEPHROLITHOTOMY Left 11/29/2015   Procedure: NEPHROLITHOTOMY PERCUTANEOUS WITH URETERAL STENT PLACEMENT /BIOPSIES;  Surgeon: Hollice Espy, MD;  Location: [facility];  Service: Urology;  Laterality: Left;   TONSILLECTOMY     WISDOM TOOTH EXTRACTION Bilateral    all 4   Social History:  reports that she quit smoking about 22 years ago. She has never used smokeless tobacco. She reports current alcohol use. She reports that she does not use drugs.  Allergies  Allergen Reactions   Bee Venom Shortness Of Breath   Adhesive [Tape] Other (See Comments)    Whelp on skin.   Cefuroxime Axetil     REACTION: GI distress   Erythromycin Base Nausea Only    Extreme  nausea    Family History  Problem Relation Age of Onset   Prostate cancer Father    Urolithiasis Mother    Kidney disease Neg Hx    Kidney cancer Neg Hx     Prior to Admission medications   Medication Sig Start Date End Date Taking? Authorizing Provider  WIXELA INHUB 250-50 MCG/ACT AEPB Inhale 1 puff into the lungs in the morning and at bedtime. 06/27/22  Yes [provider]  atorvastatin (LIPITOR) 20 MG tablet Take 1 tablet by mouth daily.    [provider]   cholecalciferol (VITAMIN D) 1000 UNITS tablet Take 2,000 Units by mouth daily after supper. Reported on 12/12/2015    [provider]  glipiZIDE (GLUCOTROL) 10 MG tablet Take 10 mg by mouth 2 (two) times daily before a meal. 11/17/21   [provider]  LANTUS SOLOSTAR 100 UNIT/ML Solostar Pen Inject into the skin.    [provider]  lisinopril (ZESTRIL) 5 MG tablet Take 1 tablet by mouth daily. 11/29/21   [provider]  metFORMIN (GLUCOPHAGE-XR) 500 MG 24 hr tablet Take 2 tablets by mouth 2 (two) times daily with a meal. 12/13/21   [provider]  metoprolol succinate (TOPROL-XL) 50 MG 24 hr tablet Take 50 mg by mouth daily. Take with or immediately following a meal at 8pm.    [provider]  Multiple Vitamin (MULTI-VITAMINS) TABS Take 1 tablet by mouth daily.    [provider]  oseltamivir (TAMIFLU) 75 MG capsule Take 1 capsule (75 mg total) by mouth 2 (two) times daily. 05/18/22   Gwynne Edinger, MD    Physical Exam: Vitals:   09/05/22 0400 09/05/22 0618 09/05/22 0618 09/05/22 0654  BP: (!) 149/94 (!) 144/87  127/65  Pulse: 97   99  Resp:   20 20  Temp:    97.7 F (36.5 C)  TempSrc:      SpO2: 97%   98%  Weight:      Height:       Physical Exam Constitutional:      General: She is not in acute distress.    Appearance: She is obese.  HENT:     Head: Normocephalic and atraumatic.     Mouth/Throat:     Mouth: Mucous membranes are moist.  Eyes:     Pupils: Pupils are equal, round, and reactive to light.  Cardiovascular:     Rate and Rhythm: Normal rate and regular rhythm.  Pulmonary:     Effort: Pulmonary effort is normal.     Breath sounds: Wheezing present.  Abdominal:     General: Bowel sounds are normal.  Musculoskeletal:        General: Normal range of motion.     Cervical back: Normal range of motion.  Skin:    General: Skin is warm.  Neurological:     General: No focal deficit present.  Psychiatric:         Mood and Affect: Mood normal.     Data Reviewed:  There are no new results to review at this time. DG Neck Soft Tissue CLINICAL DATA:  Shortness of breath  EXAM: NECK SOFT TISSUES - 1+ VIEW  COMPARISON:  None Available.  FINDINGS: There is no evidence of retropharyngeal soft tissue swelling or epiglottic enlargement. The cervical airway is unremarkable and no radio-opaque foreign body identified.  IMPRESSION: Negative.  Electronically Signed   By: Jorje Guild M.D.   On: 09/05/2022 04:46 DG Chest [facility]  1 View CLINICAL DATA:  Shortness of breath  EXAM: PORTABLE CHEST 1 VIEW  COMPARISON:  05/18/2022  FINDINGS: Enlarged heart size. Congested appearance of vessels. Linear scarring in the left mid lung which is stable. No effusion or pneumothorax. Artifact from EKG leads.  IMPRESSION: Chronic cardiomegaly with borderline vascular congestion and mild left pulmonary scarring. No acute finding.  Electronically Signed   By: Jorje Guild M.D.   On: 09/05/2022 04:46  Lab Results  Component Value Date   WBC 9.6 09/05/2022   HGB 13.1 09/05/2022   HCT 41.6 09/05/2022   MCV 91.2 09/05/2022   PLT 281 [id number]   Last metabolic panel Lab Results  Component Value Date   GLUCOSE 280 (H) 09/05/2022   NA 138 09/05/2022   K 4.0 09/05/2022   CL 104 09/05/2022   CO2 24 09/05/2022   BUN 16 09/05/2022   CREATININE 0.81 09/05/2022   GFRNONAA >60 09/05/2022   CALCIUM 9.0 09/05/2022   PHOS 4.7 (H) 06/09/2008   PROT 7.2 09/05/2022   ALBUMIN 3.9 09/05/2022   BILITOT 0.4 09/05/2022   ALKPHOS 74 09/05/2022   AST 28 09/05/2022   ALT 32 09/05/2022   ANIONGAP 10 09/05/2022     Assessment and Plan: Acute respiratory failure with hypoxia (HCC) New O2 requirement up to 2L in setting of URI sxs w/ wheezing, cough, mild sputum production (noted similar presentation 05/2022 assd w/ Flu A)  Suspect viral bronchitis w/ possible component of cardiomegaly with  underlying likely undiagnosed obstructive lung disease   Noted prior 20+ pack year smoking history without formal diagnosis of COPD/Asthma  Some ? Croup given persistent barking cough and sick contact w/ similar sxs (daughter)  CXR fairly stable apart from changes concerning for vascular congestion  Covid, flu and RSV pending  Strep negative.  IV solumedrol  Duonebs w/ prn racemic epinephrine  Expanded resp panel Low dose lasix x 1  Formal cardiology consult pending in setting of uptrending troponins.     NSTEMI (non-ST elevated myocardial infarction) ([place]) Trop 42--> 169 in setting of acute resp failure  No active CP  CXR w/ vascular congestion  Suspect heart strain in setting of viral bronchitis w/ some possible component of CHF given vascular congestion on CXR  Will add on BNP x 1 HEART Score 5+ given age, trop, RFs.  Will formally reach out to cardiology for consult.  Start ASA   Hypothyroidism Currently not on medication  Check TSH   Type 2 diabetes mellitus without complications ([place]) SSI  A1C  Monitor for hyperglycemia in setting of steroid use.    Dyslipidemia Cont statin  Check lipid panel    Essential hypertension BP stable  Titrate home regimen        Advance Care Planning:   Code Status: Full Code   Consults: Cardiology w/ Dr. Fletcher Anon   Family Communication: Husband at the bedside   Severity of Illness: The appropriate patient status for this patient is OBSERVATION. Observation status is judged to be reasonable and necessary in order to provide the required intensity of service to ensure the patient's safety. The patient's presenting symptoms, physical exam findings, and initial radiographic and laboratory data in the context of their medical condition is felt to place them at decreased risk for further clinical deterioration. Furthermore, it is anticipated that the patient will be medically stable for discharge from the hospital within 2 midnights of  admission.   Author: Deneise Lever, MD 09/05/2022 8:11 AM  For on call  review [email or web address].

## 2022-09-05 NOTE — Assessment & Plan Note (Addendum)
New O2 requirement up to 2L in setting of URI sxs w/ wheezing, cough, mild sputum production (noted similar presentation 05/2022 assd w/ Flu A)  Suspect viral bronchitis w/ possible component of cardiomegaly with underlying likely undiagnosed obstructive lung disease   Noted prior 20+ pack year smoking history without formal diagnosis of COPD/Asthma  Some ? Croup given persistent barking cough and sick contact w/ similar sxs (daughter)  CXR fairly stable apart from changes concerning for vascular congestion  Covid, flu and RSV pending  Strep negative.  IV solumedrol  Duonebs w/ prn racemic epinephrine  Expanded resp panel Low dose lasix x 1  Formal cardiology consult pending in setting of uptrending troponins.

## 2022-09-06 ENCOUNTER — Other Ambulatory Visit (HOSPITAL_COMMUNITY): Payer: Self-pay

## 2022-09-06 ENCOUNTER — Observation Stay: Payer: BC Managed Care – PPO

## 2022-09-06 DIAGNOSIS — E039 Hypothyroidism, unspecified: Secondary | ICD-10-CM | POA: Diagnosis present

## 2022-09-06 DIAGNOSIS — Z7951 Long term (current) use of inhaled steroids: Secondary | ICD-10-CM | POA: Diagnosis not present

## 2022-09-06 DIAGNOSIS — K589 Irritable bowel syndrome without diarrhea: Secondary | ICD-10-CM | POA: Diagnosis present

## 2022-09-06 DIAGNOSIS — J9601 Acute respiratory failure with hypoxia: Secondary | ICD-10-CM | POA: Diagnosis present

## 2022-09-06 DIAGNOSIS — T380X5A Adverse effect of glucocorticoids and synthetic analogues, initial encounter: Secondary | ICD-10-CM | POA: Diagnosis not present

## 2022-09-06 DIAGNOSIS — Z794 Long term (current) use of insulin: Secondary | ICD-10-CM | POA: Diagnosis not present

## 2022-09-06 DIAGNOSIS — E1165 Type 2 diabetes mellitus with hyperglycemia: Secondary | ICD-10-CM | POA: Diagnosis not present

## 2022-09-06 DIAGNOSIS — Z23 Encounter for immunization: Secondary | ICD-10-CM | POA: Diagnosis not present

## 2022-09-06 DIAGNOSIS — J101 Influenza due to other identified influenza virus with other respiratory manifestations: Secondary | ICD-10-CM | POA: Diagnosis present

## 2022-09-06 DIAGNOSIS — M1990 Unspecified osteoarthritis, unspecified site: Secondary | ICD-10-CM | POA: Diagnosis present

## 2022-09-06 DIAGNOSIS — E785 Hyperlipidemia, unspecified: Secondary | ICD-10-CM | POA: Diagnosis present

## 2022-09-06 DIAGNOSIS — Z6841 Body Mass Index (BMI) 40.0 and over, adult: Secondary | ICD-10-CM | POA: Diagnosis not present

## 2022-09-06 DIAGNOSIS — Z1152 Encounter for screening for COVID-19: Secondary | ICD-10-CM | POA: Diagnosis not present

## 2022-09-06 DIAGNOSIS — F419 Anxiety disorder, unspecified: Secondary | ICD-10-CM | POA: Diagnosis present

## 2022-09-06 DIAGNOSIS — R0902 Hypoxemia: Secondary | ICD-10-CM | POA: Diagnosis present

## 2022-09-06 DIAGNOSIS — Z79899 Other long term (current) drug therapy: Secondary | ICD-10-CM | POA: Diagnosis not present

## 2022-09-06 DIAGNOSIS — Z881 Allergy status to other antibiotic agents status: Secondary | ICD-10-CM | POA: Diagnosis not present

## 2022-09-06 DIAGNOSIS — Z7984 Long term (current) use of oral hypoglycemic drugs: Secondary | ICD-10-CM | POA: Diagnosis not present

## 2022-09-06 DIAGNOSIS — I2489 Other forms of acute ischemic heart disease: Secondary | ICD-10-CM | POA: Diagnosis present

## 2022-09-06 DIAGNOSIS — J209 Acute bronchitis, unspecified: Secondary | ICD-10-CM | POA: Diagnosis not present

## 2022-09-06 DIAGNOSIS — I10 Essential (primary) hypertension: Secondary | ICD-10-CM | POA: Diagnosis present

## 2022-09-06 DIAGNOSIS — J351 Hypertrophy of tonsils: Secondary | ICD-10-CM | POA: Diagnosis present

## 2022-09-06 DIAGNOSIS — I444 Left anterior fascicular block: Secondary | ICD-10-CM | POA: Diagnosis present

## 2022-09-06 DIAGNOSIS — K219 Gastro-esophageal reflux disease without esophagitis: Secondary | ICD-10-CM | POA: Diagnosis present

## 2022-09-06 LAB — COMPREHENSIVE METABOLIC PANEL
ALT: 26 U/L (ref 0–44)
AST: 22 U/L (ref 15–41)
Albumin: 3.6 g/dL (ref 3.5–5.0)
Alkaline Phosphatase: 63 U/L (ref 38–126)
Anion gap: 7 (ref 5–15)
BUN: 24 mg/dL — ABNORMAL HIGH (ref 8–23)
CO2: 25 mmol/L (ref 22–32)
Calcium: 9 mg/dL (ref 8.9–10.3)
Chloride: 103 mmol/L (ref 98–111)
Creatinine, Ser: 0.8 mg/dL (ref 0.44–1.00)
GFR, Estimated: >60 mL/min (ref >60)
Glucose, Bld: 286 mg/dL — ABNORMAL HIGH (ref 70–99)
Potassium: 3.9 mmol/L (ref 3.5–5.1)
Sodium: 135 mmol/L (ref 135–145)
Total Bilirubin: 0.6 mg/dL (ref 0.3–1.2)
Total Protein: 6.6 g/dL (ref 6.5–8.1)

## 2022-09-06 LAB — GLUCOSE, CAPILLARY
Glucose-Capillary: 227 mg/dL — ABNORMAL HIGH (ref 70–99)
Glucose-Capillary: 298 mg/dL — ABNORMAL HIGH (ref 70–99)
Glucose-Capillary: 445 mg/dL — ABNORMAL HIGH (ref 70–99)
Glucose-Capillary: 342 mg/dL — ABNORMAL HIGH (ref 70–99)
Glucose-Capillary: 295 mg/dL — ABNORMAL HIGH (ref 70–99)
Glucose-Capillary: 221 mg/dL — ABNORMAL HIGH (ref 70–99)

## 2022-09-06 LAB — CBC
HCT: 36.6 % (ref 36.0–46.0)
Hemoglobin: 11.8 g/dL — ABNORMAL LOW (ref 12.0–15.0)
MCH: 28.9 pg (ref 26.0–34.0)
MCHC: 32.2 g/dL (ref 30.0–36.0)
MCV: 89.5 fL (ref 80.0–100.0)
Platelets: 312 10*3/uL (ref 150–400)
RBC: 4.09 MIL/uL (ref 3.87–5.11)
RDW: 14.6 % (ref 11.5–15.5)
WBC: 13.2 10*3/uL — ABNORMAL HIGH (ref 4.0–10.5)
nRBC: 0 % (ref 0.0–0.2)

## 2022-09-06 LAB — BASIC METABOLIC PANEL
Anion gap: 12 (ref 5–15)
BUN: 26 mg/dL — ABNORMAL HIGH (ref 8–23)
CO2: 25 mmol/L (ref 22–32)
Calcium: 9.4 mg/dL (ref 8.9–10.3)
Chloride: 96 mmol/L — ABNORMAL LOW (ref 98–111)
Creatinine, Ser: 1.13 mg/dL — ABNORMAL HIGH (ref 0.44–1.00)
GFR, Estimated: 54 mL/min — ABNORMAL LOW (ref >60)
Glucose, Bld: 423 mg/dL — ABNORMAL HIGH (ref 70–99)
Potassium: 4.8 mmol/L (ref 3.5–5.1)
Sodium: 133 mmol/L — ABNORMAL LOW (ref 135–145)

## 2022-09-06 LAB — T4, FREE: Free T4: 0.68 ng/dL (ref 0.61–1.12)

## 2022-09-06 LAB — PROCALCITONIN: Procalcitonin: <0.1 ng/mL

## 2022-09-06 LAB — HEMOGLOBIN A1C
Hgb A1c MFr Bld: 8.7 % — ABNORMAL HIGH (ref 4.8–5.6)
Mean Plasma Glucose: 203 mg/dL

## 2022-09-06 MED ORDER — LIVING WELL WITH DIABETES BOOK
Freq: Once | Status: AC
  Filled 2022-09-06: qty 1

## 2022-09-06 MED ORDER — IPRATROPIUM-ALBUTEROL 0.5-2.5 (3) MG/3ML IN SOLN: 3.0000 mL | RESPIRATORY_TRACT | Status: DC | PRN

## 2022-09-06 MED ORDER — VITAMIN D 25 MCG (1000 UNIT) PO TABS
2000.0000 [IU] | ORAL_TABLET | Freq: Every day | ORAL | Status: DC
  Administered 2022-09-06: 2000 [IU] via ORAL
  Filled 2022-09-06 (×2): qty 2

## 2022-09-06 MED ORDER — INSULIN GLARGINE-YFGN 100 UNIT/ML ~~LOC~~ SOLN
5.0000 [IU] | Freq: Once | SUBCUTANEOUS | Status: AC
  Administered 2022-09-06: 5 [IU] via SUBCUTANEOUS
  Filled 2022-09-06: qty 0.05

## 2022-09-06 MED ORDER — INSULIN GLARGINE-YFGN 100 UNIT/ML ~~LOC~~ SOLN
20.0000 [IU] | Freq: Every day | SUBCUTANEOUS | Status: DC
  Administered 2022-09-06: 20 [IU] via SUBCUTANEOUS
  Filled 2022-09-06: qty 0.2

## 2022-09-06 MED ORDER — BENZONATATE 100 MG PO CAPS
200.0000 mg | ORAL_CAPSULE | Freq: Three times a day (TID) | ORAL | Status: DC
  Administered 2022-09-06 – 2022-09-07 (×4): 200 mg via ORAL
  Filled 2022-09-06 (×4): qty 2

## 2022-09-06 MED ORDER — HYDROCOD POLI-CHLORPHE POLI ER 10-8 MG/5ML PO SUER: 5.0000 mL | Freq: Two times a day (BID) | ORAL | Status: DC | PRN

## 2022-09-06 MED ORDER — GUAIFENESIN-DM 100-10 MG/5ML PO SYRP
5.0000 mL | ORAL_SOLUTION | ORAL | Status: DC | PRN
  Administered 2022-09-07 (×2): 5 mL via ORAL
  Filled 2022-09-06 (×2): qty 10

## 2022-09-06 MED ORDER — INSULIN ASPART 100 UNIT/ML IJ SOLN
10.0000 [IU] | Freq: Once | INTRAMUSCULAR | Status: AC
  Administered 2022-09-06: 10 [IU] via SUBCUTANEOUS
  Filled 2022-09-06: qty 1

## 2022-09-06 MED ORDER — METHYLPREDNISOLONE SODIUM SUCC 40 MG IJ SOLR
40.0000 mg | Freq: Every day | INTRAMUSCULAR | Status: DC
  Filled 2022-09-06: qty 1

## 2022-09-06 MED ORDER — METHYLPREDNISOLONE SODIUM SUCC 40 MG IJ SOLR
40.0000 mg | Freq: Two times a day (BID) | INTRAMUSCULAR | Status: DC
  Administered 2022-09-06: 40 mg via INTRAVENOUS
  Filled 2022-09-06: qty 1

## 2022-09-06 MED ORDER — MOMETASONE FURO-FORMOTEROL FUM 200-5 MCG/ACT IN AERO
2.0000 | INHALATION_SPRAY | Freq: Two times a day (BID) | RESPIRATORY_TRACT | Status: DC
  Administered 2022-09-06 – 2022-09-07 (×3): 2 via RESPIRATORY_TRACT
  Filled 2022-09-06: qty 8.8

## 2022-09-06 MED ORDER — VITAMIN B-12 1000 MCG PO TABS
2000.0000 ug | ORAL_TABLET | Freq: Every day | ORAL | Status: DC
  Administered 2022-09-06 – 2022-09-07 (×2): 2000 ug via ORAL
  Filled 2022-09-06 (×2): qty 2

## 2022-09-06 MED ORDER — INSULIN GLARGINE-YFGN 100 UNIT/ML ~~LOC~~ SOLN
15.0000 [IU] | Freq: Every day | SUBCUTANEOUS | Status: DC
  Filled 2022-09-06: qty 0.15

## 2022-09-06 MED ORDER — INSULIN GLARGINE-YFGN 100 UNIT/ML ~~LOC~~ SOLN
25.0000 [IU] | Freq: Every day | SUBCUTANEOUS | Status: DC
  Administered 2022-09-07: 25 [IU] via SUBCUTANEOUS
  Filled 2022-09-06: qty 0.25

## 2022-09-06 MED ORDER — IOHEXOL 300 MG/ML  SOLN
75.0000 mL | Freq: Once | INTRAMUSCULAR | Status: AC | PRN
  Administered 2022-09-06: 75 mL via INTRAVENOUS

## 2022-09-06 MED ORDER — INSULIN ASPART 100 UNIT/ML IJ SOLN
0.0000 [IU] | INTRAMUSCULAR | Status: DC
  Administered 2022-09-06: 8 [IU] via SUBCUTANEOUS
  Administered 2022-09-06: 15 [IU] via SUBCUTANEOUS
  Administered 2022-09-07: 5 [IU] via SUBCUTANEOUS
  Administered 2022-09-07 (×2): 3 [IU] via SUBCUTANEOUS
  Administered 2022-09-07: 5 [IU] via SUBCUTANEOUS
  Filled 2022-09-06 (×6): qty 1

## 2022-09-06 NOTE — Progress Notes (Signed)
Patient stated she checked her sugar 2 hours after eating lunch using her home glucometer with 485 reading. I checked it, and it was 445. She c/o blurry vision, exteremly thirsty, jittery and very tired. Dr. Priscella Mann notified. New orders obtained.

## 2022-09-06 NOTE — Plan of Care (Signed)
  Problem: Activity: Goal: Risk for activity intolerance will decrease Outcome: Progressing   Problem: Safety: Goal: Ability to remain free from injury will improve Outcome: Progressing   

## 2022-09-06 NOTE — Progress Notes (Signed)
PROGRESS NOTE    Diamond Sosa  [id number] DOB: 23-Oct-1955 DOA: 09/05/2022 PCP: Juluis Pitch, MD    Brief Narrative:  67 y.o. female with medical history significant of anxiety, osteoarthritis, type 2 diabetes mellitus, GERD, hypertension, hypothyroidism, IBS presenting w/ acute resp failure w/ hypoxia, NSTEMI. Pt reports increased WOB over the past 2-3 days. + rhinorrhea, nasal congestion. + sick contact in daughter w/ similar sxs. Noted prior 20+ pack years smoking history. No formal diagnosis of COPD/Asthma in the past. No fevers or chills. + recurrent cough. Mild wheezing. Noted LE swelling w/ orthopnea. Pt denies any CP. No hemiparesis or confusion.  Presented to ER afebrile, hemodynamically stable. Requiring 2 L nasal cannula to keep O2 sats greater than 95%.  COVID flu and RSV are pending.  Rapid strep negative.  Chest x-ray with borderline cardiomegaly and vascular congestion.  There was some concern for stridor, so soft tissues of the neck are obtained that were grossly stable.  Given IV Solu-Medrol, DuoNebs and racemic epinephrine with mild improvement in symptoms.  Troponin 42-->169.  EKG stable.    3/28: No clear infectious etiology identified.  COVID, flu, RSV negative.  RVP negative.  Procalcitonin negative.  CT soft tissue neck with tonsillar enlargement that could be seen in the presence of tonsillitis however patient is not infected appearing.  NSTEMI ruled out.   Assessment & Plan:   Principal Problem:   Acute bronchitis Active Problems:   Acute respiratory failure with hypoxia (HCC)   NSTEMI (non-ST elevated myocardial infarction) (HCC)   Hypothyroidism   Type 2 diabetes mellitus without complications ([place])   Essential hypertension   Dyslipidemia  Acute respiratory failure with hypoxia (HCC) New oxygen requirement 2 L.  This is resolved.  Patient has symptoms that could be concerning for upper respiratory tract infection.  Respiratory viral panel, flu COVID,  flu, RSV all negative.  Procalcitonin negative.  Not indicative of bacterial infection.  CT soft tissue neck demonstrates patent airway however does demonstrate some tonsillar enlargement.  Possible chronic tonsillitis that is exacerbated by some underlying viral syndrome. Plan: Continue IV steroids Continue bronchodilators Antitussives Monitor vitals and fever curve Oxygen saturation with vital signs Consider CTA chest versus pulmonary consult versus ENT consult should patient symptoms not improve   NSTEMI (non-ST elevated myocardial infarction) ([place]), ruled out Troponin newly elevated without any significant delta.  Seen in consultation by cardiology.  No indication of ACS.  No indication for heparin GTT.  Cardiology suggesting outpatient evaluation with nuclear stress test versus cardiac CTA.   Hypothyroidism TSH checked on admission.  Elevated to 8.8 Will check free T4 Consider starting Synthroid based on T4 levels versus having patient follow-up with primary postdischarge   Type 2 diabetes mellitus without complications [facility]) Diabetes coordinator consulted.  Semglee 20 units daily.  Moderate sliding scale.  Carb modified diet.  Accu-Cheks before meals and at bedtime.     Dyslipidemia Cont statin    Essential hypertension BP stable   Morbid obesity BMI 44.29.  This complicates overall care and prognosis.  DVT prophylaxis: SQ Lovenox Code Status: Full Family Communication: None today Disposition Plan: Status is: Inpatient Remains inpatient appropriate because: Acute respiratory distress on IV steroids.  Anticipated date of discharge 3/29.     Level of care: Telemetry Medical  Consultants:  None  Procedures:  None  Antimicrobials: None   Subjective: Seen and examined.  Main complaint is cough and shortness of breath.  No pain complaints.  Objective: Vitals:   09/05/22  2132 09/05/22 2339 09/06/22 0832 09/06/22 1100  BP:  (!) 104/42 (!) 117/59   Pulse:  77 74    Resp:  20 17   Temp:  97.7 F (36.5 C) 98.5 F (36.9 C)   TempSrc:      SpO2: 96% 99% 97% 98%  Weight:      Height:        Intake/Output Summary (Last 24 hours) at 09/06/2022 1332 Last data filed at 09/06/2022 1000 Gross per 24 hour  Intake 240 ml  Output 300 ml  Net -60 ml   Filed Weights   09/05/22 0357  Weight: 113.4 kg    Examination:  General exam: Appears uncomfortable Respiratory system: Bibasilar crackles.  Normal work of breathing.  Room air Cardiovascular system: S1-S2, RRR, no murmurs, no pedal edema Gastrointestinal system: Obese, soft, NT/ND, normal bowel sounds Central nervous system: Alert and oriented. No focal neurological deficits. Extremities: Symmetric 5 x 5 power. Skin: No rashes, lesions or ulcers Psychiatry: Judgement and insight appear normal. Mood & affect appropriate.     Data Reviewed: I have personally reviewed following labs and imaging studies  CBC: Recent Labs  Lab 09/05/22 0400 09/06/22 0432  WBC 9.6 13.2*  NEUTROABS 6.4  --   HGB 13.1 11.8*  HCT 41.6 36.6  MCV 91.2 89.5  PLT 281 [id number]   Basic Metabolic Panel: Recent Labs  Lab 09/05/22 0400 09/05/22 0845 09/06/22 0432  NA 138  --  135  K 4.0  --  3.9  CL 104  --  103  CO2 24  --  25  GLUCOSE 280* 381* 286*  BUN 16  --  24*  CREATININE 0.81  --  0.80  CALCIUM 9.0  --  9.0   GFR: Estimated Creatinine Clearance: 86.1 mL/min (by C-G formula based on SCr of 0.8 mg/dL). Liver Function Tests: Recent Labs  Lab 09/05/22 0400 09/06/22 0432  AST 28 22  ALT 32 26  ALKPHOS 74 63  BILITOT 0.4 0.6  PROT 7.2 6.6  ALBUMIN 3.9 3.6   No results for input(s): "LIPASE", "AMYLASE" in the last 168 hours. No results for input(s): "AMMONIA" in the last 168 hours. Coagulation Profile: No results for input(s): "INR", "PROTIME" in the last 168 hours. Cardiac Enzymes: No results for input(s): "CKTOTAL", "CKMB", "CKMBINDEX", "TROPONINI" in the last 168 hours. BNP (last 3  results) No results for input(s): "PROBNP" in the last 8760 hours. HbA1C: Recent Labs    09/05/22 0747  HGBA1C 8.7*   CBG: Recent Labs  Lab 09/05/22 1157 09/05/22 1718 09/05/22 2150 09/06/22 0754 09/06/22 1114  GLUCAP 398* 359* 289* 227* 298*   Lipid Profile: Recent Labs    09/05/22 0845  CHOL 135  HDL 35*  LDLCALC 75  TRIG 126  CHOLHDL 3.9   Thyroid Function Tests: Recent Labs    09/05/22 0746  TSH 8.845*   Anemia Panel: No results for input(s): "VITAMINB12", "FOLATE", "FERRITIN", "TIBC", "IRON", "RETICCTPCT" in the last 72 hours. Sepsis Labs: Recent Labs  Lab 09/06/22 0432  PROCALCITON <0.10    Recent Results (from the past 240 hour(s))  Resp panel by RT-PCR (RSV, Flu A&B, Covid) Anterior Nasal Swab     Status: None   Collection Time: 09/05/22  4:00 AM   Specimen: Anterior Nasal Swab  Result Value Ref Range Status   SARS Coronavirus 2 by RT PCR NEGATIVE NEGATIVE Final    Comment: (NOTE) SARS-CoV-2 target nucleic acids are NOT DETECTED.  The SARS-CoV-2 RNA is generally  detectable in upper respiratory specimens during the acute phase of infection. The lowest concentration of SARS-CoV-2 viral copies this assay can detect is 138 copies/mL. A negative result does not preclude SARS-Cov-2 infection and should not be used as the sole basis for treatment or other patient management decisions. A negative result may occur with  improper specimen collection/handling, submission of specimen other than nasopharyngeal swab, presence of viral mutation(s) within the areas targeted by this assay, and inadequate number of viral copies(<138 copies/mL). A negative result must be combined with clinical observations, patient history, and epidemiological information. The expected result is Negative.  Fact Sheet for Patients:  EntrepreneurPulse.com.au  Fact Sheet for Healthcare Providers:  IncredibleEmployment.be  This test is no t  yet approved or cleared by the [place] FDA and  has been authorized for detection and/or diagnosis of SARS-CoV-2 by FDA under an Emergency Use Authorization (EUA). This EUA will remain  in effect (meaning this test can be used) for the duration of the COVID-19 declaration under Section 564(b)(1) of the Act, 21 U.S.C.section 360bbb-3(b)(1), unless the authorization is terminated  or revoked sooner.       Influenza A by PCR NEGATIVE NEGATIVE Final   Influenza B by PCR NEGATIVE NEGATIVE Final    Comment: (NOTE) The Xpert Xpress SARS-CoV-2/FLU/RSV plus assay is intended as an aid in the diagnosis of influenza from Nasopharyngeal swab specimens and should not be used as a sole basis for treatment. Nasal washings and aspirates are unacceptable for Xpert Xpress SARS-CoV-2/FLU/RSV testing.  Fact Sheet for Patients: EntrepreneurPulse.com.au  Fact Sheet for Healthcare Providers: IncredibleEmployment.be  This test is not yet approved or cleared by the [place] FDA and has been authorized for detection and/or diagnosis of SARS-CoV-2 by FDA under an Emergency Use Authorization (EUA). This EUA will remain in effect (meaning this test can be used) for the duration of the COVID-19 declaration under Section 564(b)(1) of the Act, 21 U.S.C. section 360bbb-3(b)(1), unless the authorization is terminated or revoked.     Resp Syncytial Virus by PCR NEGATIVE NEGATIVE Final    Comment: (NOTE) Fact Sheet for Patients: EntrepreneurPulse.com.au  Fact Sheet for Healthcare Providers: IncredibleEmployment.be  This test is not yet approved or cleared by the [place] FDA and has been authorized for detection and/or diagnosis of SARS-CoV-2 by FDA under an Emergency Use Authorization (EUA). This EUA will remain in effect (meaning this test can be used) for the duration of the COVID-19 declaration under Section 564(b)(1)  of the Act, 21 U.S.C. section 360bbb-3(b)(1), unless the authorization is terminated or revoked.  Performed at [facility], [place], [place] [address]   Group A Strep by PCR [facility] Only)     Status: None   Collection Time: 09/05/22  4:02 AM   Specimen: Anterior Nasal Swab; Sterile Swab  Result Value Ref Range Status   Group A Strep by PCR NOT DETECTED NOT DETECTED Final    Comment: Performed at [facility], [place], [place] [address]  Respiratory (~20 pathogens) panel by PCR     Status: None   Collection Time: 09/05/22  8:31 AM   Specimen: Nasopharyngeal Swab; Respiratory  Result Value Ref Range Status   Adenovirus NOT DETECTED NOT DETECTED Final   Coronavirus 229E NOT DETECTED NOT DETECTED Final    Comment: (NOTE) The Coronavirus on the Respiratory Panel, DOES NOT test for the novel  Coronavirus (2019 nCoV)    Coronavirus HKU1 NOT DETECTED NOT DETECTED Final  Coronavirus NL63 NOT DETECTED NOT DETECTED Final   Coronavirus OC43 NOT DETECTED NOT DETECTED Final   Metapneumovirus NOT DETECTED NOT DETECTED Final   Rhinovirus / Enterovirus NOT DETECTED NOT DETECTED Final   Influenza A NOT DETECTED NOT DETECTED Final   Influenza B NOT DETECTED NOT DETECTED Final   Parainfluenza Virus 1 NOT DETECTED NOT DETECTED Final   Parainfluenza Virus 2 NOT DETECTED NOT DETECTED Final   Parainfluenza Virus 3 NOT DETECTED NOT DETECTED Final   Parainfluenza Virus 4 NOT DETECTED NOT DETECTED Final   Respiratory Syncytial Virus NOT DETECTED NOT DETECTED Final   Bordetella pertussis NOT DETECTED NOT DETECTED Final   Bordetella Parapertussis NOT DETECTED NOT DETECTED Final   Chlamydophila pneumoniae NOT DETECTED NOT DETECTED Final   Mycoplasma pneumoniae NOT DETECTED NOT DETECTED Final    Comment: Performed at [facility], [place] [address], [place], [place] [address]         Radiology Studies: CT SOFT TISSUE NECK W CONTRAST  Result  Date: 09/06/2022 CLINICAL DATA:  Epiglottitis or tonsillitis suspected. Cough, sore throat. EXAM: CT NECK WITH CONTRAST TECHNIQUE: Multidetector CT imaging of the neck was performed using the standard protocol following the bolus administration of intravenous contrast. RADIATION DOSE REDUCTION: This exam was performed according to the departmental dose-optimization program which includes automated exposure control, adjustment of the mA and/or kV according to patient size and/or use of iterative reconstruction technique. CONTRAST:  69mL OMNIPAQUE IOHEXOL 300 MG/ML  SOLN COMPARISON:  Soft tissue neck radiographs 09/05/2022. FINDINGS: Pharynx and larynx: Prominent bilateral lingual tonsils. Glottis is closed. No fluid collection in the neck. Salivary glands: No inflammation, mass, or stone. Thyroid: Normal. Lymph nodes: No suspicious cervical lymphadenopathy. Vascular: Atherosclerotic calcifications of the carotid bulbs. Limited intracranial: Unremarkable. Visualized orbits: Unremarkable. Mastoids and visualized paranasal sinuses: Well aerated. Skeleton: No suspicious bone lesions. Upper chest: Unremarkable. Other: None. IMPRESSION: 1. Prominent bilateral lingual tonsils, which can be seen in the setting of tonsillitis. No fluid collection in the neck. 2. No suspicious cervical lymphadenopathy. Electronically Signed   By: Emmit Alexanders M.D.   On: 09/06/2022 10:55   ECHOCARDIOGRAM COMPLETE  Result Date: 09/05/2022    ECHOCARDIOGRAM REPORT   Patient Name:   JAQUIRA [facility] Date of Exam: 09/05/2022 Medical Rec #:  [id number]      Height:       63.0 in Accession #:    [id number]     Weight:       250.0 lb Date of Birth:  1956/01/25      BSA:          2.126 m Patient Age:    81 years       BP:           127/65 mmHg Patient Gender: F              HR:           99 bpm. Exam Location:  [facility] Procedure: 2D Echo, Cardiac Doppler and Color Doppler Indications:     Dyspnea R06.00  History:         Patient has no prior history of  Echocardiogram examinations.                  Signs/Symptoms:Murmur; Risk Factors:Hypertension.  Sonographer:     Sherrie Sport Referring Phys:  [id number] SHERI HAMMOCK Diagnosing Phys: Kate Sable MD IMPRESSIONS  1. Left ventricular ejection fraction, by estimation, is 60 to 65%. The left ventricle has normal function.  The left ventricle has no regional wall motion abnormalities. Left ventricular diastolic parameters are consistent with Grade I diastolic dysfunction (impaired relaxation).  2. Right ventricular systolic function is normal. The right ventricular size is normal.  3. The mitral valve is normal in structure. No evidence of mitral valve regurgitation.  4. The aortic valve was not well visualized. Aortic valve regurgitation is not visualized. FINDINGS  Left Ventricle: Left ventricular ejection fraction, by estimation, is 60 to 65%. The left ventricle has normal function. The left ventricle has no regional wall motion abnormalities. The left ventricular internal cavity size was normal in size. There is  no left ventricular hypertrophy. Left ventricular diastolic parameters are consistent with Grade I diastolic dysfunction (impaired relaxation). Right Ventricle: The right ventricular size is normal. No increase in right ventricular wall thickness. Right ventricular systolic function is normal. Left Atrium: Left atrial size was normal in size. Right Atrium: Right atrial size was normal in size. Pericardium: There is no evidence of pericardial effusion. Mitral Valve: The mitral valve is normal in structure. No evidence of mitral valve regurgitation. MV peak gradient, 6.5 mmHg. The mean mitral valve gradient is 3.0 mmHg. Tricuspid Valve: The tricuspid valve is normal in structure. Tricuspid valve regurgitation is not demonstrated. Aortic Valve: The aortic valve was not well visualized. Aortic valve regurgitation is not visualized. Aortic valve mean gradient measures 6.0 mmHg. Aortic valve peak gradient  measures 11.7 mmHg. Aortic valve area, by VTI measures 3.33 cm. Pulmonic Valve: The pulmonic valve was normal in structure. Pulmonic valve regurgitation is not visualized. Aorta: The aortic root is normal in size and structure. Venous: The inferior vena cava was not well visualized. IAS/Shunts: No atrial level shunt detected by color flow Doppler.  LEFT VENTRICLE PLAX 2D LVOT diam:     2.00 cm LV SV:         85 LV SV Index:   40 LVOT Area:     3.14 cm  RIGHT VENTRICLE RV Basal diam:  2.90 cm RV Mid diam:    2.60 cm RV S prime:     20.80 cm/s TAPSE (M-mode): 2.4 cm LEFT ATRIUM           Index        RIGHT ATRIUM           Index LA diam:      3.00 cm 1.41 cm/m   RA Area:     14.20 cm LA Vol (A4C): 23.8 ml 11.19 ml/m  RA Volume:   32.30 ml  15.19 ml/m  AORTIC VALVE AV Area (Vmax):    2.66 cm AV Area (Vmean):   3.20 cm AV Area (VTI):     3.33 cm AV Vmax:           171.00 cm/s AV Vmean:          105.000 cm/s AV VTI:            0.256 m AV Peak Grad:      11.7 mmHg AV Mean Grad:      6.0 mmHg LVOT Vmax:         145.00 cm/s LVOT Vmean:        107.000 cm/s LVOT VTI:          0.271 m LVOT/AV VTI ratio: 1.06  AORTA Ao Root diam: 3.00 cm MITRAL VALVE                TRICUSPID VALVE MV Area (PHT): 4.96 cm  TR Peak grad:   18.8 mmHg MV Area VTI:   4.13 cm     TR Vmax:        217.00 cm/s MV Peak grad:  6.5 mmHg MV Mean grad:  3.0 mmHg     SHUNTS MV Vmax:       1.27 m/s     Systemic VTI:  0.27 m MV Vmean:      87.1 cm/s    Systemic Diam: 2.00 cm MV Decel Time: 153 msec MV E velocity: 87.00 cm/s MV A velocity: 123.00 cm/s MV E/A ratio:  0.71 Kate Sable MD Electronically signed by Kate Sable MD Signature Date/Time: 09/05/2022/2:17:55 PM    Final    DG Neck Soft Tissue  Result Date: 09/05/2022 CLINICAL DATA:  Shortness of breath EXAM: NECK SOFT TISSUES - 1+ VIEW COMPARISON:  None Available. FINDINGS: There is no evidence of retropharyngeal soft tissue swelling or epiglottic enlargement. The cervical  airway is unremarkable and no radio-opaque foreign body identified. IMPRESSION: Negative. Electronically Signed   By: Jorje Guild M.D.   On: 09/05/2022 04:46   DG Chest Port 1 View  Result Date: 09/05/2022 CLINICAL DATA:  Shortness of breath EXAM: PORTABLE CHEST 1 VIEW COMPARISON:  05/18/2022 FINDINGS: Enlarged heart size. Congested appearance of vessels. Linear scarring in the left mid lung which is stable. No effusion or pneumothorax. Artifact from EKG leads. IMPRESSION: Chronic cardiomegaly with borderline vascular congestion and mild left pulmonary scarring. No acute finding. Electronically Signed   By: Jorje Guild M.D.   On: 09/05/2022 04:46        Scheduled Meds:  aspirin EC  325 mg Oral Daily   atorvastatin  20 mg Oral Daily   benzonatate  200 mg Oral TID   cholecalciferol  2,000 Units Oral QHS   cyanocobalamin  2,000 mcg Oral Daily   enoxaparin (LOVENOX) injection  0.5 mg/kg Subcutaneous Q24H   insulin aspart  0-15 Units Subcutaneous TID WC   insulin aspart  0-5 Units Subcutaneous QHS   insulin glargine-yfgn  20 Units Subcutaneous Daily   lisinopril  5 mg Oral Daily   methylPREDNISolone (SOLU-MEDROL) injection  40 mg Intravenous Q12H   metoprolol succinate  50 mg Oral Daily   mometasone-formoterol  2 puff Inhalation BID   multivitamin with minerals  1 tablet Oral Daily   Continuous Infusions:   LOS: 1 day       Sidney Ace, MD [facility]   If 7PM-7AM, please contact night-coverage  09/06/2022, 1:32 PM

## 2022-09-06 NOTE — Discharge Instructions (Addendum)

## 2022-09-06 NOTE — TOC Progression Note (Signed)
Transition of Care [facility]) - Progression Note    Patient Details  Name: Diamond Sosa MRN: [id number] Date of Birth: 01-21-1956  [facility] of [facility]) CM/SW [place], RN Phone Number: 09/06/2022, 11:36 AM  Clinical Narrative:    TOC continues to follow the patient for needs, She is on Room air at this time Has been seen by Diabetic educator and provided with education    Expected Discharge Plan: Home/Self Care Barriers to Discharge: Continued Medical Work up  Expected Discharge Plan and Services   Discharge Planning Services: CM Consult   Living arrangements for the past 2 months: Single Family Home                                       Social Determinants of Health (SDOH) Interventions SDOH Screenings   Food Insecurity: No Food Insecurity (09/05/2022)  Housing: Low Risk  (09/05/2022)  Transportation Needs: No Transportation Needs (09/05/2022)  Utilities: Not At Risk (09/05/2022)  Tobacco Use: Medium Risk (09/05/2022)    Readmission Risk Interventions     No data to display

## 2022-09-06 NOTE — Plan of Care (Signed)
  RD consulted for nutrition education regarding diabetes.   Lab Results  Component Value Date   HGBA1C 8.7 (H) 09/05/2022   Spoke with pt at bedside, who was very tearful at time of visit. Pt reports she has experienced a general decline in health over the past 2-2.5 years. She reports she quit her job around 2 years ago because "I couldn't keep up with the demands" and her health has deteriorated since then. Pt reports that she had been decreasingly less mobile since then secondary to respiratory status, edema in legs, and foot pain. (Suspect pt may have neuropathy, but has never been diagnosed or treated for this). Pt also concerned that she has gained weight secondary to limited mobility.   Pt extremely concerned about her blood sugar control. She shares she has given steroids this admission and blood sugars rose to 400's, which concerned her. Pt also concerned about the amount of insulin given in hospital. Discussed acute stress response and how this impacts blood sugars as well as management of DM in hospital vs outpatient. Pt shares that her blood sugars are erratic at home, but have improved (usually less than 200's) and decreased Hgb A1c from 14 to 8. Pt reports that she feels that everything runs her blood sugars up and has limited carbs in her diet. Pt typically consumes 3 meals per day (Breakfast: avocado and eggs; Lunch: salad; Dinner: pizza or pasta). Pt also snacks on nuts and cheese.   RD provided active listening to pt concerns. RD reviewed diabetes self-management with pt and she is excited to try the [employer] CGM, which she thinks will help her.   RD provided referral to [facility] for further support and reinforcement. Pt agreeable to referral.   Case discussed with DM coordinator, who reports she messaged MD regarding concerns of neuropathy.   RD provided "Carbohydrate Counting for People with Diabetes" handout from the Academy of  Nutrition and Dietetics. Discussed different food groups and their effects on blood sugar, emphasizing carbohydrate-containing foods. Provided list of carbohydrates and recommended serving sizes of common foods.  Discussed importance of controlled and consistent carbohydrate intake throughout the day. Provided examples of ways to balance meals/snacks and encouraged intake of high-fiber, whole grain complex carbohydrates. Teach back method used.  Expect fair compliance.  Body mass index is 44.29 kg/m. Pt meets criteria for obesity, class III based on current BMI. Obesity is a complex, chronic medical condition that is optimally managed by a multidisciplinary care team. Weight loss is not an ideal goal for an acute inpatient hospitalization. However, if further work-up for obesity is warranted, consider outpatient referral to [facility].    Current diet order is heart healthy/ carb modified (liberalized to carb modified), patient is consuming approximately 100% of meals at this time. Labs and medications reviewed. No further nutrition interventions warranted at this time. RD contact information provided. If additional nutrition issues arise, please re-consult RD.  Loistine Chance, RD, LDN, [place] Registered Dietitian II Certified Diabetes Care and Education Specialist Please refer to [facility] for RD and/or RD on-call/weekend/after hours pager

## 2022-09-06 NOTE — Inpatient Diabetes Management (Addendum)
Inpatient Diabetes Program Recommendations  AACE/ADA: New Consensus Statement on Inpatient Glycemic Control  Target Ranges:  Prepandial:   less than 140 mg/dL      Peak postprandial:   less than 180 mg/dL (1-2 hours)      Critically ill patients:  140 - 180 mg/dL    Latest Reference Range & Units 09/05/22 08:06 09/05/22 11:57 09/05/22 17:18 09/05/22 21:50  Glucose-Capillary 70 - 99 mg/dL 418 (H) 398 (H) 359 (H) 289 (H)    Latest Reference Range & Units 09/05/22 04:00 09/05/22 07:47 09/05/22 08:45 09/06/22 04:32  Glucose 70 - 99 mg/dL 280 (H)  381 (H) 286 (H)  Hemoglobin A1C 4.8 - 5.6 %  8.7 (H)     Review of Glycemic Control  Diabetes history: DM2 Outpatient Diabetes medications: Glipizide 10 mg BID, Metformin XR 1000 mg BID Current orders for Inpatient glycemic control: Semglee 15 units daily, Novolog 0-15 units TID with meals, Novolog 0-5 units QHS   Inpatient Diabetes Program Recommendations:     Insulin: Patient received Semglee 10 units on 09/05/22 and lab glucose 286 mg/dl today.  Please consider increasing Semglee 20 units daily.   HbgA1C:A1C was 8.7% on 09/05/22 indicating an average glucose of 203 mg/dl over the past 2-3 months.  Outpatient DM: Given patient's significant diarrhea, would recommend to discontinue Metformin outpatient. Patient is willing to resume taking insulin outpatient and would be willing to take basal and bolus insulin. If basal and bolus insulin is prescribed at discharge, would recommend to decrease or discontinue Glipizide and have patient follow up with PCP.  NOTE: Patient admitted with acute respiratory failure with hypoxia and NSTEMI. Patient received Semglee 10 units on 09/05/22 and glucose ranged from 289-418 mg/dl on 09/05/22. Per chart, "NT" charted for each meal on 09/05/22. In reviewing chart, noted patient use to take Ozempic and noted Lantus listed on home medication list but no dose or frequency. Called patient over the phone to inquire about DM  medications. Patient states she is only taking Glipizide 10 mg BID and Metformin XR 1000 mg BID for DM. Patient reports that she stopped Ozempic several months ago due to "the things I seen on TV about all the problems people have from it". Inquired about Lantus and patient states that her PCP started her on Lantus 10 units daily and when she took it, it dropped her glucose too low and she could not hardly function. Patient reports that with the Lantus her glucose was down to low 100's (103) and she felt terrible and could not function to do the things she needed to do everyday. Patient also notes that she has significant diarrhea and has to always be close to a bathroom. Patient states that she takes care of her 2 grandchildren so she has to be able to take care of them. Patient notes that her fasting glucose is usually 160-180's mg/dl and she checks her glucose about once a day. Discussed glucose and A1C goals. Discussed importance of checking CBGs and maintaining good CBG control to prevent long-term and short-term complications. Explained how hyperglycemia leads to damage within blood vessels which lead to the common complications seen with uncontrolled diabetes. Stressed to the patient the importance of improving glycemic control to prevent further complications from uncontrolled diabetes. Discussed impact of nutrition, exercise, stress, sickness, and medications on diabetes control. Patient reports that she needs to lose weight, that she is not sure what to eat, she is not able to exercise due to pain in legs and  feet.  Patient reports that she has tingling and burning in legs and feet and tingling in hands. Patient has never been dx with neuropathy. Discussed neuropathy and encouraged patient to talk with provider about the burning and tingling she is having in hands, feet, and legs.  Discussed carbohydrates, carbohydrate goals per day and meal, along with portion sizes. Patient requested to talk with RD to  get more information on carb modified diet, meal plans, snacks, and how to change diet to help with weight loss. Patient states that she knows she needs to get her DM under better control but she is not sure how to do that. She would like to just take oral DM medications if possible versus taking insulin. Patient reports that she tried Jardiance in the past but developed yeast infection so it was stopped.  Patient has never tried any DPP-4 medications.  Encouraged patient to get established with an Endocrinologist to assist with improving DM control. Discussed that she likely feels symptoms of hypoglycemia with glucose is in a normal range because her body is use to her sugars being higher. Explained that if glucose targets and dropped gradually then her body can adjust to lower glucose trends and will get to the point where she can tolerate lower more normal glucose targets. Discussed continuous glucose monitoring sensors and how they work and could provide more information for her and her providers to improve DM control. Patient is interested in using a CGM; will ask attending provider if samples of FreeStyle Libre3 could be provided to patient. Patient reports she has an android phone that is older so she is not sure if it would be compatible or not. Asked patient to check and see if she could download the [employer] app.   Patient verbalized understanding of information discussed and reports no further questions at this time related to diabetes. Ordered RD consult and Living Well with DM book. Asked outpt TOC pharmacy to check to see if FreeStyle Libre3 or Dexcom G7 are covered and told that patient's insurance covers both and copay is $0.  Insurance covers [employer] and [employer] [phone]).  Addendum 09/06/22@13 :00-Spoke with patient at bedside regarding DM, diet, A1C, insulin, FreeStyle Libre3 CGM. Patient has living well with DM book at bedside and has been reading the book.   Patient  notes that she has diarrhea severely in that she rarely leaves home due to having to be near a bathroom at all times. Discussed GI side effects of Metformin and informed patient that it would be recommended to stop the Metformin to see if diarrhea improves (but up to attending provider at discharge as to what changes with DM medication that would be made). Explained that it would be recommended also that she restart taking insulin. Discussed Lantus and how it works; also discussed Novolog/Humalog insulin and how it works. Patient is fearful of hypoglycemia because she can't function and fearful of hyperglycemia because her mother and sister had a stroke and she is afraid that hyperglycemia make cause her to have a stroke as well. Discussed again how her body may be used to higher glucose levels and when her glucose is brought down too quickly she feels symptoms of hypoglycemia. Discussed bringing glucose goals down gradually to allow her body to get accustomed to lower more normal glucose levels. Discussed how the FreeStyle Libre3 CGM would be monitoring glucose constantly and has alarms that would alert her if glucose is low or high above values set up  in the app or on the reader device. Patient is having her daughter bring another phone (current phone is not compatible) she has at home to see if it is compatible with the FreeStyle Libre3 app; if not patient will need a prescription for FreeStyle Libre3 reader device at discharge.  Patient asked to review insulin administration with an insulin pen to ensure she is using it correctly. Discussed insulin storage, insulin injection sites, and importance of rotating injection sites. Discussed steps of using the insulin pen and patient was able to successfully demonstrate how to use the insulin and feels more confident that she was doing it correctly. Patient is willing to resume taking Lantus and is willing to take short acting insulin as well if prescribed at  discharge. Discussed current A1C of 8.7% indicating an average glucose of 203 mg/dl. Patient was surprised that current A1C was still in the 8% range; she felt it would be in the 7% range based on fasting glucose values she was seeing at home. Discussed carb modified diet, portion control, and eating carbs in moderation. Discussed impact of exercise and encouraged patient to try to incorporate exercise daily at least 30 mins a day. Patient notes that her daughter and 2 grandchildren live with her and her husband. Patient notes that she does a lot for the grandchildren and she needs to be more physically active in order to continue to do what she needs to do.   Discussed getting established with [facility] and patient states that she use to see a doctor there but it has been many years ago. Encouraged patient to get reestablished with Endocrinology to get assistance with improving diabetes management. Patient verbalized understanding of information discussed and states she feels more knowledgeable and confident about managing her DM. Will plan to have diabetes coordinator see patient on 09/07/22 to provide FreeStyle Libre3 sensor samples and to assist patient to apply it.   Thanks, Barnie Alderman, RN, MSN, CDE Diabetes Coordinator Inpatient Diabetes Program [phone] (Team Pager)

## 2022-09-07 DIAGNOSIS — J209 Acute bronchitis, unspecified: Secondary | ICD-10-CM | POA: Diagnosis not present

## 2022-09-07 LAB — GLUCOSE, CAPILLARY
Glucose-Capillary: 172 mg/dL — ABNORMAL HIGH (ref 70–99)
Glucose-Capillary: 176 mg/dL — ABNORMAL HIGH (ref 70–99)
Glucose-Capillary: 234 mg/dL — ABNORMAL HIGH (ref 70–99)
Glucose-Capillary: 236 mg/dL — ABNORMAL HIGH (ref 70–99)

## 2022-09-07 MED ORDER — PNEUMOCOCCAL 20-VAL CONJ VACC 0.5 ML IM SUSY
0.5000 mL | PREFILLED_SYRINGE | Freq: Once | INTRAMUSCULAR | Status: AC
  Administered 2022-09-07: 0.5 mL via INTRAMUSCULAR
  Filled 2022-09-07: qty 0.5

## 2022-09-07 MED ORDER — PNEUMOCOCCAL 20-VAL CONJ VACC 0.5 ML IM SUSY: 0.5000 mL | PREFILLED_SYRINGE | INTRAMUSCULAR | Status: DC

## 2022-09-07 MED ORDER — "PEN NEEDLES 3/16"" 31G X 5 MM MISC": 1.0000 | Freq: Every day | 1 refills | Status: AC

## 2022-09-07 MED ORDER — LANTUS SOLOSTAR 100 UNIT/ML ~~LOC~~ SOPN: 12.0000 [IU] | PEN_INJECTOR | Freq: Every day | SUBCUTANEOUS | 11 refills | Status: AC

## 2022-09-07 MED ORDER — DULERA 100-5 MCG/ACT IN AERO: 2.0000 | INHALATION_SPRAY | Freq: Every day | RESPIRATORY_TRACT | 1 refills | Status: AC

## 2022-09-07 MED ORDER — BENZONATATE 100 MG PO CAPS: 100.0000 mg | ORAL_CAPSULE | Freq: Four times a day (QID) | ORAL | 0 refills | Status: AC | PRN

## 2022-09-07 NOTE — TOC Progression Note (Signed)
Transition of Care [facility]) - Progression Note    Patient Details  Name: Diamond Sosa MRN: [id number] Date of Birth: May 14, 1956  Transition [facility]) CM/SW [place], RN Phone Number: 09/07/2022, 9:41 AM  Clinical Narrative:    THE patient remains on Room air, No TOC needs identified,    Expected Discharge Plan: Home/Self Care Barriers to Discharge: Continued Medical Work up  Expected Discharge Plan and Services   Discharge Planning Services: CM Consult   Living arrangements for the past 2 months: Single Family Home                                       Social Determinants of Health (SDOH) Interventions SDOH Screenings   Food Insecurity: No Food Insecurity (09/05/2022)  Housing: Low Risk  (09/05/2022)  Transportation Needs: No Transportation Needs (09/05/2022)  Utilities: Not At Risk (09/05/2022)  Tobacco Use: Medium Risk (09/05/2022)    Readmission Risk Interventions     No data to display

## 2022-09-07 NOTE — Inpatient Diabetes Management (Addendum)
Inpatient Diabetes Program Recommendations  AACE/ADA: New Consensus Statement on Inpatient Glycemic Control (2015)  Target Ranges:  Prepandial:   less than 140 mg/dL      Peak postprandial:   less than 180 mg/dL (1-2 hours)      Critically ill patients:  140 - 180 mg/dL    Latest Reference Range & Units 09/06/22 07:54 09/06/22 11:14 09/06/22 15:12 09/06/22 17:05 09/06/22 20:10 09/06/22 22:57  Glucose-Capillary 70 - 99 mg/dL 227 (H)  5 units Novolog   20 units Semglee @0933  298 (H)  8 units Novolog 445 (H)  25 units Novolog 342 (H)    5 units Semglee @1843  295 (H)  8 units Novolog 221 (H)  (H): Data is abnormally high  Latest Reference Range & Units 09/07/22 00:10 09/07/22 03:38 09/07/22 08:27  Glucose-Capillary 70 - 99 mg/dL 234 (H) 176 (H) 172 (H)  (H): Data is abnormally high   Admit with: Acute respiratory failure with hypoxia   History: DM2  Home DM Meds: Glipizide 10 mg BID      Metformin XR 1000 mg BID   Current Orders: Semglee 25 units daily    Novolog 0-15 units Q4 hours    Novolog 0-5 units QHS     Note Semglee increased to 25 units Daily yesterday  CBG better this AM  Per DM Coordinator notes from 3/28, pt willing to resume Insulin at home  FreeStyle Libre3 copay is $0.    Insurance covers [employer] [phone]) and  Eugenia Mcalpine [phone])    12pm--Met w/ pt and husband at bedside.  Reviewed Freestyle Libre 3 CGM system (how to use, how to place new sensor, sensor life, troubleshooting, warm-up time, how to use app on phone, rotation of insertion sites, etc).  Assisted pt to download the Freestyle app to her Phone.  Assisted pt to open and place Libre sensor on L Upper arm.  Pt knows they will able to see glucose readings 1 hr after sensor placed/started.  Pt instructed to replace sensor in 14 days.  Encouraged pt to monitor CBGs pre and post meals.  Reviewed healthy CBG goals for home.  Pt asked to seek refills for the sensors from their  PCP.  Pt educated to check fingerstick CBG with traditional CBG meter when glucose reading does not match how they feels.  RN made aware that CGM applied.    Educated patient and spouse on insulin pen use at home.  Reviewed all steps of insulin pen including attachment of needle, 2-unit air shot, dialing up dose, giving injection, rotation of injection sites, removing needle, disposal of sharps, storage of unused insulin, disposal of insulin etc.  Patient able to provide successful return demonstration.  Reviewed troubleshooting with insulin pen.  Also reviewed with pt importance of consistent timing with Lantus insulin.  Also reviewed Signs/Symptoms of Hypoglycemia with patient and how to treat Hypoglycemia at home.  Pt requesting Rx for shorter pen needles at home--Request sent to MD via [place].   --Will follow patient during hospitalization--  Wyn Quaker RN, MSN, [place] Diabetes Coordinator Inpatient Glycemic Control Team Team Pager: [phone] (8a-5p)

## 2022-09-07 NOTE — Discharge Summary (Addendum)
Diamond Sosa [id number] DOB: [birth year] DOA: 09/05/2022  PCP: Juluis Pitch, MD  Admit date: 09/05/2022 Discharge date: 09/07/2022  Time spent: 35 minutes  Recommendations for Outpatient Follow-up:  Pcp and ENT f/u  Cardiology f/u    Discharge Diagnoses:  Principal Problem:   Acute bronchitis Active Problems:   Acute respiratory failure with hypoxia ([place])   Hypothyroidism   Type 2 diabetes mellitus without complications ([place])   Essential hypertension   Dyslipidemia   Hypoxia   Discharge Condition: stable  Diet recommendation: heart healthy  Filed Weights   09/05/22 0357  Weight: 113.4 kg    History of present illness:  From admission h and p Diamond Sosa is a 67 y.o. female with medical history significant of anxiety, osteoarthritis, type 2 diabetes mellitus, GERD, hypertension, hypothyroidism, IBS presenting w/ acute resp failure w/ hypoxia, NSTEMI. Pt reports increased WOB over the past 2-3 days. + rhinorrhea, nasal congestion. + sick contact in daughter w/ similar sxs. Noted prior 20+ pack years smoking history. No formal diagnosis of COPD/Asthma in the past. No fevers or chills. + recurrent cough. Mild wheezing. Noted LE swelling w/ orthopnea. Pt denies any CP. No hemiparesis or confusion.  Presented to ER afebrile, hemodynamically stable. Requiring 2 L nasal cannula to keep O2 sats greater than 95%.  COVID flu and RSV are pending.  Rapid strep negative.  Chest x-ray with borderline cardiomegaly and vascular congestion.  There was some concern for stridor, so soft tissues of the neck are obtained that were grossly stable.  Given IV Solu-Medrol, DuoNebs and racemic epinephrine with mild improvement in symptoms.  Troponin 42-->169.  EKG stable.   Hospital Course:  Patient presented with shortness of breath and elevated troponins. Evaluated by cardiology thought to be demand ischemic, not ACS. Did initially require 2 liters but since weaned off oxygen and is satting  100% on room air breathing comfortably. Viral w/u negative, procal negative, nothing focal on cxr. Patient's main complaint is sinus drainage and a sensation of throat closing (now resolved). Was given IV steroids that have been discontinued 2/2 hyperglycemia. CT of neck shows prominent lingual tonsils otherwise normal. Will d/c with cardiology w/u (outpatient stress test is needed) and ENT referral for her complaints of chronic sinus drainage and throat swelling. At this point most likely diagnosis is acute bronchitis. For her hyperglycemia worsened by steroids DM educator consulted. Started on glucose monitor, will d/c off glipizide and on lantus titrating that to avoid hypoglycemia and severe hyperglycemia. Will need pcp f/u for ongoing diabetes management.   Procedures: none   Consultations: cardiololgy  Discharge Exam: Vitals:   09/07/22 0013 09/07/22 0829  BP: (!) 110/53 (!) 115/55  Pulse: 72 77  Resp: 18 16  Temp: (!) 97.5 F (36.4 C) 98.1 F (36.7 C)  SpO2: 97% 100%    General: NAD Cardiovascular: RRR Respiratory: CTAB  Discharge Instructions   Discharge Instructions     Amb Referral to Nutrition and Diabetic Education   Complete by: As directed    Ambulatory referral to ENT   Complete by: As directed    Diet - low sodium heart healthy   Complete by: As directed    Increase activity slowly   Complete by: As directed       Allergies as of 09/07/2022       Reactions   Bee Venom Shortness Of Breath   Adhesive [tape] Other (See Comments)   Whelp on skin.   Cefuroxime Axetil    REACTION:  GI distress   Erythromycin Base Nausea Only   Extreme nausea        Medication List     STOP taking these medications    glipiZIDE 10 MG tablet Commonly known as: GLUCOTROL   oseltamivir 75 MG capsule Commonly known as: TAMIFLU       TAKE these medications    atorvastatin 20 MG tablet Commonly known as: LIPITOR Take 1 tablet by mouth daily.   cholecalciferol  1000 units tablet Commonly known as: VITAMIN D Take 2,000 Units by mouth daily after supper. Reported on 12/12/2015   cyanocobalamin 1000 MCG tablet Commonly known as: VITAMIN B12 Take 2,000 mcg by mouth daily.   Lantus SoloStar 100 UNIT/ML Solostar Pen Generic drug: insulin glargine Inject 12 Units into the skin at bedtime. What changed:  how much to take when to take this   lisinopril 5 MG tablet Commonly known as: ZESTRIL Take 1 tablet by mouth daily.   metFORMIN 500 MG 24 hr tablet Commonly known as: GLUCOPHAGE-XR Take 2 tablets by mouth 2 (two) times daily with a meal.   metoprolol succinate 50 MG 24 hr tablet Commonly known as: TOPROL-XL Take 50 mg by mouth daily. Take with or immediately following a meal at 8pm.   Multi-Vitamins Tabs Take 1 tablet by mouth daily.   Wixela Inhub 250-50 MCG/ACT Aepb Generic drug: fluticasone-salmeterol Inhale 1 puff into the lungs in the morning and at bedtime.       Allergies  Allergen Reactions   Bee Venom Shortness Of Breath   Adhesive [Tape] Other (See Comments)    Whelp on skin.   Cefuroxime Axetil     REACTION: GI distress   Erythromycin Base Nausea Only    Extreme nausea    Follow-up Information     Juluis Pitch, MD Follow up.   Specialty: Family Medicine Contact information: [place] [phone]         Carloyn Manner, MD Follow up.   Specialty: Otolaryngology Contact information: [place] [id number] [phone]                  The results of significant diagnostics from this hospitalization (including imaging, microbiology, ancillary and laboratory) are listed below for reference.    Significant Diagnostic Studies: CT SOFT TISSUE NECK W CONTRAST  Result Date: 09/06/2022 CLINICAL DATA:  Epiglottitis or tonsillitis suspected. Cough, sore throat. EXAM: CT NECK WITH CONTRAST TECHNIQUE: Multidetector CT imaging of the  neck was performed using the standard protocol following the bolus administration of intravenous contrast. RADIATION DOSE REDUCTION: This exam was performed according to the departmental dose-optimization program which includes automated exposure control, adjustment of the mA and/or kV according to patient size and/or use of iterative reconstruction technique. CONTRAST:  24mL OMNIPAQUE IOHEXOL 300 MG/ML  SOLN COMPARISON:  Soft tissue neck radiographs 09/05/2022. FINDINGS: Pharynx and larynx: Prominent bilateral lingual tonsils. Glottis is closed. No fluid collection in the neck. Salivary glands: No inflammation, mass, or stone. Thyroid: Normal. Lymph nodes: No suspicious cervical lymphadenopathy. Vascular: Atherosclerotic calcifications of the carotid bulbs. Limited intracranial: Unremarkable. Visualized orbits: Unremarkable. Mastoids and visualized paranasal sinuses: Well aerated. Skeleton: No suspicious bone lesions. Upper chest: Unremarkable. Other: None. IMPRESSION: 1. Prominent bilateral lingual tonsils, which can be seen in the setting of tonsillitis. No fluid collection in the neck. 2. No suspicious cervical lymphadenopathy. Electronically Signed   By: Emmit Alexanders M.D.   On: 09/06/2022 10:55   ECHOCARDIOGRAM  COMPLETE  Result Date: 09/05/2022    ECHOCARDIOGRAM REPORT   Patient Name:   Diamond Sosa Date of Exam: 09/05/2022 Medical Rec #:  [id number]      Height:       63.0 in Accession #:    [id number]     Weight:       250.0 lb Date of Birth:  [birth year]      BSA:          2.126 m Patient Age:    18 years       BP:           127/65 mmHg Patient Gender: F              HR:           99 bpm. Exam Location:  [facility] Procedure: 2D Echo, Cardiac Doppler and Color Doppler Indications:     Dyspnea R06.00  History:         Patient has no prior history of Echocardiogram examinations.                  Signs/Symptoms:Murmur; Risk Factors:Hypertension.  Sonographer:     Sherrie Sport Referring Phys:  [id number] SHERI  HAMMOCK Diagnosing Phys: Kate Sable MD IMPRESSIONS  1. Left ventricular ejection fraction, by estimation, is 60 to 65%. The left ventricle has normal function. The left ventricle has no regional wall motion abnormalities. Left ventricular diastolic parameters are consistent with Grade I diastolic dysfunction (impaired relaxation).  2. Right ventricular systolic function is normal. The right ventricular size is normal.  3. The mitral valve is normal in structure. No evidence of mitral valve regurgitation.  4. The aortic valve was not well visualized. Aortic valve regurgitation is not visualized. FINDINGS  Left Ventricle: Left ventricular ejection fraction, by estimation, is 60 to 65%. The left ventricle has normal function. The left ventricle has no regional wall motion abnormalities. The left ventricular internal cavity size was normal in size. There is  no left ventricular hypertrophy. Left ventricular diastolic parameters are consistent with Grade I diastolic dysfunction (impaired relaxation). Right Ventricle: The right ventricular size is normal. No increase in right ventricular wall thickness. Right ventricular systolic function is normal. Left Atrium: Left atrial size was normal in size. Right Atrium: Right atrial size was normal in size. Pericardium: There is no evidence of pericardial effusion. Mitral Valve: The mitral valve is normal in structure. No evidence of mitral valve regurgitation. MV peak gradient, 6.5 mmHg. The mean mitral valve gradient is 3.0 mmHg. Tricuspid Valve: The tricuspid valve is normal in structure. Tricuspid valve regurgitation is not demonstrated. Aortic Valve: The aortic valve was not well visualized. Aortic valve regurgitation is not visualized. Aortic valve mean gradient measures 6.0 mmHg. Aortic valve peak gradient measures 11.7 mmHg. Aortic valve area, by VTI measures 3.33 cm. Pulmonic Valve: The pulmonic valve was normal in structure. Pulmonic valve regurgitation is not  visualized. Aorta: The aortic root is normal in size and structure. Venous: The inferior vena cava was not well visualized. IAS/Shunts: No atrial level shunt detected by color flow Doppler.  LEFT VENTRICLE PLAX 2D LVOT diam:     2.00 cm LV SV:         85 LV SV Index:   40 LVOT Area:     3.14 cm  RIGHT VENTRICLE RV Basal diam:  2.90 cm RV Mid diam:    2.60 cm RV S prime:     20.80 cm/s TAPSE (M-mode): 2.4 cm LEFT  ATRIUM           Index        RIGHT ATRIUM           Index LA diam:      3.00 cm 1.41 cm/m   RA Area:     14.20 cm LA Vol (A4C): 23.8 ml 11.19 ml/m  RA Volume:   32.30 ml  15.19 ml/m  AORTIC VALVE AV Area (Vmax):    2.66 cm AV Area (Vmean):   3.20 cm AV Area (VTI):     3.33 cm AV Vmax:           171.00 cm/s AV Vmean:          105.000 cm/s AV VTI:            0.256 m AV Peak Grad:      11.7 mmHg AV Mean Grad:      6.0 mmHg LVOT Vmax:         145.00 cm/s LVOT Vmean:        107.000 cm/s LVOT VTI:          0.271 m LVOT/AV VTI ratio: 1.06  AORTA Ao Root diam: 3.00 cm MITRAL VALVE                TRICUSPID VALVE MV Area (PHT): 4.96 cm     TR Peak grad:   18.8 mmHg MV Area VTI:   4.13 cm     TR Vmax:        217.00 cm/s MV Peak grad:  6.5 mmHg MV Mean grad:  3.0 mmHg     SHUNTS MV Vmax:       1.27 m/s     Systemic VTI:  0.27 m MV Vmean:      87.1 cm/s    Systemic Diam: 2.00 cm MV Decel Time: 153 msec MV E velocity: 87.00 cm/s MV A velocity: 123.00 cm/s MV E/A ratio:  0.71 Kate Sable MD Electronically signed by Kate Sable MD Signature Date/Time: 09/05/2022/2:17:55 PM    Final    DG Neck Soft Tissue  Result Date: 09/05/2022 CLINICAL DATA:  Shortness of breath EXAM: NECK SOFT TISSUES - 1+ VIEW COMPARISON:  None Available. FINDINGS: There is no evidence of retropharyngeal soft tissue swelling or epiglottic enlargement. The cervical airway is unremarkable and no radio-opaque foreign body identified. IMPRESSION: Negative. Electronically Signed   By: Jorje Guild M.D.   On: 09/05/2022 04:46    DG Chest Port 1 View  Result Date: 09/05/2022 CLINICAL DATA:  Shortness of breath EXAM: PORTABLE CHEST 1 VIEW COMPARISON:  05/18/2022 FINDINGS: Enlarged heart size. Congested appearance of vessels. Linear scarring in the left mid lung which is stable. No effusion or pneumothorax. Artifact from EKG leads. IMPRESSION: Chronic cardiomegaly with borderline vascular congestion and mild left pulmonary scarring. No acute finding. Electronically Signed   By: Jorje Guild M.D.   On: 09/05/2022 04:46    Microbiology: Recent Results (from the past 240 hour(s))  Resp panel by RT-PCR (RSV, Flu A&B, Covid) Anterior Nasal Swab     Status: None   Collection Time: 09/05/22  4:00 AM   Specimen: Anterior Nasal Swab  Result Value Ref Range Status   SARS Coronavirus 2 by RT PCR NEGATIVE NEGATIVE Final    Comment: (NOTE) SARS-CoV-2 target nucleic acids are NOT DETECTED.  The SARS-CoV-2 RNA is generally detectable in upper respiratory specimens during the acute phase of infection. The lowest concentration of SARS-CoV-2 viral copies this assay can  detect is 138 copies/mL. A negative result does not preclude SARS-Cov-2 infection and should not be used as the sole basis for treatment or other patient management decisions. A negative result may occur with  improper specimen collection/handling, submission of specimen other than nasopharyngeal swab, presence of viral mutation(s) within the areas targeted by this assay, and inadequate number of viral copies(<138 copies/mL). A negative result must be combined with clinical observations, patient history, and epidemiological information. The expected result is Negative.  Fact Sheet for Patients:  EntrepreneurPulse.com.au  Fact Sheet for Healthcare Providers:  IncredibleEmployment.be  This test is no t yet approved or cleared by the [place] FDA and  has been authorized for detection and/or diagnosis of SARS-CoV-2  by FDA under an Emergency Use Authorization (EUA). This EUA will remain  in effect (meaning this test can be used) for the duration of the COVID-19 declaration under Section 564(b)(1) of the Act, 21 U.S.C.section 360bbb-3(b)(1), unless the authorization is terminated  or revoked sooner.       Influenza A by PCR NEGATIVE NEGATIVE Final   Influenza B by PCR NEGATIVE NEGATIVE Final    Comment: (NOTE) The Xpert Xpress SARS-CoV-2/FLU/RSV plus assay is intended as an aid in the diagnosis of influenza from Nasopharyngeal swab specimens and should not be used as a sole basis for treatment. Nasal washings and aspirates are unacceptable for Xpert Xpress SARS-CoV-2/FLU/RSV testing.  Fact Sheet for Patients: EntrepreneurPulse.com.au  Fact Sheet for Healthcare Providers: IncredibleEmployment.be  This test is not yet approved or cleared by the [place] FDA and has been authorized for detection and/or diagnosis of SARS-CoV-2 by FDA under an Emergency Use Authorization (EUA). This EUA will remain in effect (meaning this test can be used) for the duration of the COVID-19 declaration under Section 564(b)(1) of the Act, 21 U.S.C. section 360bbb-3(b)(1), unless the authorization is terminated or revoked.     Resp Syncytial Virus by PCR NEGATIVE NEGATIVE Final    Comment: (NOTE) Fact Sheet for Patients: EntrepreneurPulse.com.au  Fact Sheet for Healthcare Providers: IncredibleEmployment.be  This test is not yet approved or cleared by the [place] FDA and has been authorized for detection and/or diagnosis of SARS-CoV-2 by FDA under an Emergency Use Authorization (EUA). This EUA will remain in effect (meaning this test can be used) for the duration of the COVID-19 declaration under Section 564(b)(1) of the Act, 21 U.S.C. section 360bbb-3(b)(1), unless the authorization is terminated or revoked.  Performed at  [facility], [place], [place] [address]   Group A Strep by PCR [facility] Only)     Status: None   Collection Time: 09/05/22  4:02 AM   Specimen: Anterior Nasal Swab; Sterile Swab  Result Value Ref Range Status   Group A Strep by PCR NOT DETECTED NOT DETECTED Final    Comment: Performed at [facility], [place], [place] [address]  Respiratory (~20 pathogens) panel by PCR     Status: None   Collection Time: 09/05/22  8:31 AM   Specimen: Nasopharyngeal Swab; Respiratory  Result Value Ref Range Status   Adenovirus NOT DETECTED NOT DETECTED Final   Coronavirus 229E NOT DETECTED NOT DETECTED Final    Comment: (NOTE) The Coronavirus on the Respiratory Panel, DOES NOT test for the novel  Coronavirus (2019 nCoV)    Coronavirus HKU1 NOT DETECTED NOT DETECTED Final   Coronavirus NL63 NOT DETECTED NOT DETECTED Final   Coronavirus OC43 NOT DETECTED NOT DETECTED Final   Metapneumovirus NOT DETECTED  NOT DETECTED Final   Rhinovirus / Enterovirus NOT DETECTED NOT DETECTED Final   Influenza A NOT DETECTED NOT DETECTED Final   Influenza B NOT DETECTED NOT DETECTED Final   Parainfluenza Virus 1 NOT DETECTED NOT DETECTED Final   Parainfluenza Virus 2 NOT DETECTED NOT DETECTED Final   Parainfluenza Virus 3 NOT DETECTED NOT DETECTED Final   Parainfluenza Virus 4 NOT DETECTED NOT DETECTED Final   Respiratory Syncytial Virus NOT DETECTED NOT DETECTED Final   Bordetella pertussis NOT DETECTED NOT DETECTED Final   Bordetella Parapertussis NOT DETECTED NOT DETECTED Final   Chlamydophila pneumoniae NOT DETECTED NOT DETECTED Final   Mycoplasma pneumoniae NOT DETECTED NOT DETECTED Final    Comment: Performed at [facility], [address] [place], [place] [address]     Labs: Basic Metabolic Panel: Recent Labs  Lab 09/05/22 0400 09/05/22 0845 09/06/22 0432 09/06/22 1355  NA 138  --  135 133*  K 4.0  --  3.9 4.8  CL 104  --  103 96*  CO2 24  --   25 25  GLUCOSE 280* 381* 286* 423*  BUN 16  --  24* 26*  CREATININE 0.81  --  0.80 1.13*  CALCIUM 9.0  --  9.0 9.4   Liver Function Tests: Recent Labs  Lab 09/05/22 0400 09/06/22 0432  AST 28 22  ALT 32 26  ALKPHOS 74 63  BILITOT 0.4 0.6  PROT 7.2 6.6  ALBUMIN 3.9 3.6   No results for input(s): "LIPASE", "AMYLASE" in the last 168 hours. No results for input(s): "AMMONIA" in the last 168 hours. CBC: Recent Labs  Lab 09/05/22 0400 09/06/22 0432  WBC 9.6 13.2*  NEUTROABS 6.4  --   HGB 13.1 11.8*  HCT 41.6 36.6  MCV 91.2 89.5  PLT 281 312   Cardiac Enzymes: No results for input(s): "CKTOTAL", "CKMB", "CKMBINDEX", "TROPONINI" in the last 168 hours. BNP: BNP (last 3 results) Recent Labs    09/05/22 0845  BNP 93.5    ProBNP (last 3 results) No results for input(s): "PROBNP" in the last 8760 hours.  CBG: Recent Labs  Lab 09/06/22 2010 09/06/22 2257 09/07/22 0010 09/07/22 0338 09/07/22 0827  GLUCAP 295* 221* 234* 176* 172*       Signed:  Desma Maxim MD.  [facility] 09/07/2022, 11:12 AM

## 2022-09-07 NOTE — Plan of Care (Signed)
  Problem: Clinical Measurements: Goal: Ability to maintain clinical measurements within normal limits will improve Outcome: Progressing Goal: Will remain free from infection Outcome: Progressing Goal: Diagnostic test results will improve Outcome: Progressing Goal: Respiratory complications will improve Outcome: Progressing   Problem: Coping: Goal: Level of anxiety will decrease Outcome: Progressing   Problem: Safety: Goal: Ability to remain free from injury will improve Outcome: Progressing

## 2022-09-07 NOTE — Progress Notes (Signed)
Discharge instructions reviewed with patient including followup visits and new medications.  Understanding was verbalized and all questions were answered.  IV removed without complication; patient tolerated well.  Patient discharged home via wheelchair in stable condition escorted by volunteer staff.  

## 2023-03-04 ENCOUNTER — Other Ambulatory Visit: Payer: Self-pay | Admitting: Obstetrics and Gynecology

## 2024-02-13 ENCOUNTER — Ambulatory Visit (INDEPENDENT_AMBULATORY_CARE_PROVIDER_SITE_OTHER)

## 2024-02-13 DIAGNOSIS — I788 Other diseases of capillaries: Secondary | ICD-10-CM

## 2024-02-13 DIAGNOSIS — L817 Pigmented purpuric dermatosis: Secondary | ICD-10-CM

## 2024-02-13 DIAGNOSIS — D1801 Hemangioma of skin and subcutaneous tissue: Secondary | ICD-10-CM

## 2024-02-13 DIAGNOSIS — L732 Hidradenitis suppurativa: Secondary | ICD-10-CM

## 2024-02-13 MED ORDER — CLINDAMYCIN PHOSPHATE 1 % EX SWAB: 1.0000 | Freq: Two times a day (BID) | CUTANEOUS | 5 refills | Status: AC

## 2024-02-13 NOTE — Progress Notes (Signed)
 New Patient Visit   Subjective  Diamond Sosa is a 68 y.o. female who presents for the following: Patient was referred by rheumatology for evaluation.  Per review of notes from [facility] patient with hx of false positive ANA, family hx of ANCA vasculitis & psoriatic arthritis. She has had ongoing joint pain currently being worked up by Rheum at [facility]. Referred for workup of petechial rash.   Reviewed labs from 05-11/2023 - UA w/ leuk esterase and WBC - no proteinuria - CBC/Diff, hepatic function panel, Cr, sed rate, CK, lipid panel wnl   Today patient reports approximately 3 months ago developed severe joint pain. Denies any triggers or viral illnesses around that time. States it has been slowly improving over time without intervention. Referred by Rheum due to family hx of ANCA vasculitis (in granddaughter) and lower extremity petechial rash. Note red dots on legs. In past, dx w Schamberg purpura. Worse with standing and heat. Improves with elevation and compression.  NO other rash elsewhere. NO eye sx or nail issues.   Does have tender bumps under breasts intermittently.   The following portions of the chart were reviewed this encounter and updated as appropriate: medications, allergies, medical history  Review of Systems:  No other skin or systemic complaints except as noted in HPI or Assessment and Plan.  Objective  Well appearing patient in no apparent distress; mood and affect are within normal limits.  A focused examination was performed of the following areas: Upper and lower extremities, chest, face  - Non-palpable cayenne pepper-like purpura on a background of brown-orange discoloration with some coalescence on the lower legs and dorsal feet. - pink nodules inframmamary with comedones - no nail findings - no eye or mucosal findings  - Cherry-red vascular papule(s) on arms and legs   Relevant exam findings are noted in the Assessment and Plan.    Assessment &  Plan   SCHAMBERG'S PIGMENTED PURPURA/ Capillaritis - chronic, flaring, not at goal  - Discussed Schamberg's purpura is a benign chronic condition that may also have intermittent episodes of worsening/improvement.  It is the most common type of pigmented purpura and is typically asymptomatic.  It generally affects older individuals, and may be related to leg swelling, increased activity, or excessive alcohol use.  Sometimes short term treatment with a mid-potency topical steroid is given for acute flares. - Discussed lesions today not consistent with vasculitis and unlikely to be related to ongoing joint pain - Discussed lesions may recur with activity, heat   Treatment Plan: Benign, observe.   Recommend daily graduated compression hose/stockings- easiest to put on first thing in morning, remove at bedtime.  Cherry angiomas - Reassured patient that these are benign lesions, not requiring biopsy. - Explained that more lesions may appear over time  Hidradenitis suppurativa, Hurley stage 1, mild Chronic and persistent condition with duration or expected duration over one year. Condition is symptomatic and bothersome to patient. Patient is flaring and not currently at treatment goal.  - Discussed the chronic, relapsing nature of this skin disease, characterized by recurring inflamed painful nodules with abscess and sinus formation, and scarring - Explained to patient that early, isolated lesions can remit with medical treatment, but once sinus tracts are established treatment options are limited and surgery is the only definitive treatment; however, recurrence rate can be up to 25% even with surgery -Start clindamycin  1% wipes        Return if symptoms worsen or fail to improve, for w/ Dr.  Jaaziah Schulke.  I, Jacquelynn V. Wilfred, CMA, am acting as scribe for Lauraine JAYSON Kanaris, MD .   Documentation: I have reviewed the above documentation for accuracy and completeness, and I agree with the  above.  Lauraine JAYSON Kanaris, MD

## 2024-02-13 NOTE — Patient Instructions (Signed)

## 2025-02-17 ENCOUNTER — Ambulatory Visit
